# Patient Record
Sex: Male | Born: 1980 | Race: White | Hispanic: No | Marital: Single | Smoking: Former smoker
Health system: Southern US, Community
[De-identification: ages and names within clinical notes are randomized; demographics above are authoritative.]

## PROBLEM LIST (undated history)

## (undated) DIAGNOSIS — N289 Disorder of kidney and ureter, unspecified: Secondary | ICD-10-CM

## (undated) DIAGNOSIS — Z9889 Other specified postprocedural states: Secondary | ICD-10-CM

## (undated) DIAGNOSIS — Z8719 Personal history of other diseases of the digestive system: Secondary | ICD-10-CM

## (undated) DIAGNOSIS — J45909 Unspecified asthma, uncomplicated: Secondary | ICD-10-CM

## (undated) DIAGNOSIS — G8929 Other chronic pain: Secondary | ICD-10-CM

## (undated) DIAGNOSIS — K219 Gastro-esophageal reflux disease without esophagitis: Secondary | ICD-10-CM

## (undated) DIAGNOSIS — M549 Dorsalgia, unspecified: Secondary | ICD-10-CM

## (undated) HISTORY — PX: APPENDECTOMY: SHX54

## (undated) HISTORY — PX: RHINOPLASTY: SUR1284

## (undated) HISTORY — PX: UPPER ENDOSCOPY W/ ESOPHAGEAL MANOMETRY: SHX2604

---

## 2000-04-27 ENCOUNTER — Ambulatory Visit (HOSPITAL_COMMUNITY): Admission: RE | Admit: 2000-04-27 | Discharge: 2000-04-27 | Payer: Self-pay | Admitting: Family Medicine

## 2000-04-27 ENCOUNTER — Encounter: Payer: Self-pay | Admitting: Family Medicine

## 2000-05-10 ENCOUNTER — Encounter: Payer: Self-pay | Admitting: Emergency Medicine

## 2000-05-10 ENCOUNTER — Ambulatory Visit (HOSPITAL_COMMUNITY): Admission: EM | Admit: 2000-05-10 | Discharge: 2000-05-11 | Payer: Self-pay | Admitting: Emergency Medicine

## 2000-06-26 ENCOUNTER — Ambulatory Visit (HOSPITAL_COMMUNITY): Admission: EM | Admit: 2000-06-26 | Discharge: 2000-06-27 | Payer: Self-pay | Admitting: *Deleted

## 2000-07-11 ENCOUNTER — Ambulatory Visit (HOSPITAL_COMMUNITY): Admission: RE | Admit: 2000-07-11 | Discharge: 2000-07-11 | Payer: Self-pay | Admitting: Gastroenterology

## 2000-07-11 ENCOUNTER — Encounter: Payer: Self-pay | Admitting: Gastroenterology

## 2000-10-19 ENCOUNTER — Encounter: Payer: Self-pay | Admitting: Emergency Medicine

## 2000-10-19 ENCOUNTER — Emergency Department (HOSPITAL_COMMUNITY): Admission: EM | Admit: 2000-10-19 | Discharge: 2000-10-19 | Payer: Self-pay | Admitting: Emergency Medicine

## 2002-10-29 ENCOUNTER — Encounter: Payer: Self-pay | Admitting: Emergency Medicine

## 2002-10-29 ENCOUNTER — Emergency Department (HOSPITAL_COMMUNITY): Admission: EM | Admit: 2002-10-29 | Discharge: 2002-10-29 | Payer: Self-pay | Admitting: Emergency Medicine

## 2004-05-09 ENCOUNTER — Emergency Department (HOSPITAL_COMMUNITY): Admission: EM | Admit: 2004-05-09 | Discharge: 2004-05-09 | Payer: Self-pay | Admitting: Emergency Medicine

## 2005-12-11 ENCOUNTER — Emergency Department (HOSPITAL_COMMUNITY): Admission: EM | Admit: 2005-12-11 | Discharge: 2005-12-11 | Payer: Self-pay | Admitting: Emergency Medicine

## 2010-07-25 ENCOUNTER — Emergency Department (HOSPITAL_BASED_OUTPATIENT_CLINIC_OR_DEPARTMENT_OTHER): Admission: EM | Admit: 2010-07-25 | Discharge: 2010-07-25 | Payer: Self-pay | Admitting: Emergency Medicine

## 2010-10-28 ENCOUNTER — Emergency Department (HOSPITAL_BASED_OUTPATIENT_CLINIC_OR_DEPARTMENT_OTHER): Admission: EM | Admit: 2010-10-28 | Discharge: 2010-10-28 | Payer: Self-pay | Admitting: Emergency Medicine

## 2010-10-28 ENCOUNTER — Ambulatory Visit: Payer: Self-pay | Admitting: Diagnostic Radiology

## 2010-11-09 ENCOUNTER — Emergency Department (HOSPITAL_BASED_OUTPATIENT_CLINIC_OR_DEPARTMENT_OTHER): Admission: EM | Admit: 2010-11-09 | Discharge: 2010-11-09 | Payer: Self-pay | Admitting: Emergency Medicine

## 2010-11-09 ENCOUNTER — Ambulatory Visit: Payer: Self-pay | Admitting: Diagnostic Radiology

## 2011-02-23 LAB — URINALYSIS, ROUTINE W REFLEX MICROSCOPIC
Glucose, UA: NEGATIVE mg/dL
Ketones, ur: >80 mg/dL — AB
Leukocytes, UA: NEGATIVE
Nitrite: NEGATIVE
Protein, ur: 100 mg/dL — AB
Specific Gravity, Urine: 1.036 — ABNORMAL HIGH (ref 1.005–1.030)
Urobilinogen, UA: 0.2 mg/dL (ref 0.0–1.0)
Urobilinogen, UA: 1 mg/dL (ref 0.0–1.0)

## 2011-02-23 LAB — CBC
Hemoglobin: 17.2 g/dL — ABNORMAL HIGH (ref 13.0–17.0)
MCH: 32.5 pg (ref 26.0–34.0)
MCHC: 36 g/dL (ref 30.0–36.0)
MCV: 90.1 fL (ref 78.0–100.0)
Platelets: 287 10*3/uL (ref 150–400)
RBC: 5.3 MIL/uL (ref 4.22–5.81)

## 2011-02-23 LAB — URINE CULTURE
Colony Count: 5000
Culture  Setup Time: 201111162159

## 2011-02-23 LAB — BASIC METABOLIC PANEL
CO2: 22 mEq/L (ref 19–32)
Calcium: 10.1 mg/dL (ref 8.4–10.5)
Chloride: 107 mEq/L (ref 96–112)
Creatinine, Ser: 1.2 mg/dL (ref 0.4–1.5)
GFR calc Af Amer: >60 mL/min (ref >60)
Glucose, Bld: 193 mg/dL — ABNORMAL HIGH (ref 70–99)

## 2011-02-23 LAB — DIFFERENTIAL
Basophils Relative: 0 % (ref 0–1)
Eosinophils Absolute: 0.8 10*3/uL — ABNORMAL HIGH (ref 0.0–0.7)
Eosinophils Relative: 5 % (ref 0–5)
Lymphs Abs: 3.7 10*3/uL (ref 0.7–4.0)
Monocytes Relative: 6 % (ref 3–12)

## 2011-02-23 LAB — URINE MICROSCOPIC-ADD ON

## 2011-03-31 ENCOUNTER — Emergency Department (HOSPITAL_BASED_OUTPATIENT_CLINIC_OR_DEPARTMENT_OTHER)
Admission: EM | Admit: 2011-03-31 | Discharge: 2011-04-01 | Disposition: A | Discharge status: Home / Self Care | Attending: Emergency Medicine | Admitting: Emergency Medicine

## 2011-03-31 DIAGNOSIS — R109 Unspecified abdominal pain: Secondary | ICD-10-CM | POA: Insufficient documentation

## 2011-03-31 DIAGNOSIS — Z79899 Other long term (current) drug therapy: Secondary | ICD-10-CM | POA: Insufficient documentation

## 2011-03-31 DIAGNOSIS — J45909 Unspecified asthma, uncomplicated: Secondary | ICD-10-CM | POA: Insufficient documentation

## 2011-03-31 DIAGNOSIS — G8929 Other chronic pain: Secondary | ICD-10-CM | POA: Insufficient documentation

## 2011-03-31 LAB — LIPASE, BLOOD: Lipase: 37 U/L (ref 23–300)

## 2011-03-31 LAB — CBC
MCH: 31.2 pg (ref 26.0–34.0)
MCV: 89.5 fL (ref 78.0–100.0)
Platelets: 215 10*3/uL (ref 150–400)
RBC: 4.93 MIL/uL (ref 4.22–5.81)

## 2011-03-31 LAB — URINALYSIS, ROUTINE W REFLEX MICROSCOPIC
Bilirubin Urine: NEGATIVE
Glucose, UA: NEGATIVE mg/dL
Hgb urine dipstick: NEGATIVE
Ketones, ur: NEGATIVE mg/dL
Protein, ur: NEGATIVE mg/dL

## 2011-03-31 LAB — COMPREHENSIVE METABOLIC PANEL
BUN: 13 mg/dL (ref 6–23)
CO2: 25 mEq/L (ref 19–32)
Chloride: 105 mEq/L (ref 96–112)
Creatinine, Ser: 1 mg/dL (ref 0.4–1.5)
GFR calc non Af Amer: >60 mL/min (ref >60)
Total Bilirubin: 0.7 mg/dL (ref 0.3–1.2)

## 2011-03-31 LAB — DIFFERENTIAL
Eosinophils Absolute: 0.4 10*3/uL (ref 0.0–0.7)
Lymphs Abs: 3.2 10*3/uL (ref 0.7–4.0)
Monocytes Relative: 8 % (ref 3–12)
Neutrophils Relative %: 55 % (ref 43–77)

## 2011-04-01 LAB — POCT TOXICOLOGY PANEL

## 2011-04-30 NOTE — Procedures (Signed)
[facility]  Patient:    Scott Brock                      MRN: [id number] Proc. Date: 07/11/00 Adm. Date:  [id number] Attending:  Rich Brave CC:         Elana Alm. Eliezer Lofts., M.D.   Procedure Report  PROCEDURE:  Upper endoscopy with Savary dilatation of the esophagus.  ENDOSCOPIST:  Florencia Reasons, M.D.  ANESTHESIA:  INDICATIONS:  A 30 year old male with recurrent food impactions and ongoing dysphagia symptoms.  FINDINGS:  Diffuse esophageal narrowing with mild mucosal irregularity. Dilatation performed to 16 mm.  DESCRIPTION OF PROCEDURE:  The nature, purpose, and risks of the procedure had been carefully discussed with the patient and his mother, including the risks of esophageal perforation and the possible need for emergency surgery.  He came as an outpatient to the endoscopy unit and the patient provided written consent.  The patient was taken to radiology where intravenous sedation with fentanyl 100 mcg and Versed 12 mg IV was administered in a gradual progressive fashion. Despite this large amount of medication, the patient remained awake, although somnolent, and asking "asking why he was not asleep."  He did appear, adequately sedated for the procedure.  The Olympus adult video endoscopy was passed under direct vision.  The vocal cords looked normal.  The esophagus was entered under direct vision.  The esophageal mucosa had a slightly irregular "wash board" pattern to it, and in the distal portion, there was some slight inflammatory changes without antral ulceration of the esophagus, but rather some erythema and perhaps some slight friability or erosive changes.  The scope passed easily into the stomach.  A small hiatal hernia was probably present, but no discrete rent was seen on this occasion.  The stomach contained a moderately large clear residual which was suctioned up.  The gastric mucosa was unremarkable,  without evidence of gastritis, erosions, ulcers, polyps, or masses, and a retroflexed view of the proximal stomach was unremarkable, as was the pylorus, duodenal bulb, and second duodenum.  Savary dilatation was then performed in the standard fashion.  The spring-tipped guidewire was passed into the antrum of the stomach and the scope was removed in an exchanged fashion, leaving the guidewire in place. After fluoroscopic confirmation of the appropriate positioning of the wire, I advanced a 16 mm Savary dilator over the guidewire, probably about halfway down the patients esophagus, where upon I encountered smooth resistance and therefore switched to a 14 mm dilator which passed quite easily through the esophagus, again under fluoroscopic guidance.  I then passed a 15 mm Savary dilator, and a 16 mm Savary dilator, each time with smooth resistance, perhaps a slight "give" in the more proximal esophageal region, and using fluoroscopic monitoring.  The patient was then re-endoscoped under direct vision.  There was a fair amount of fresh hemorrhage within the esophageal lumen consistent with mucosal disruption.  In the mid esophagus, and also what appeared to be a ring at the squamocolumnar junction, there appeared to be some mucosal "rent" or laceration, without any tissue flaps or evidence of perforation.  The scope was removed the patient.  He tolerated the procedure well and there were no evident complications.  IMPRESSION: 1. Diffuse mucosal irregularity within the esophagus and slight inflammatory    changes in the distal esophagus, without active ulceration, but raising the    question of chronic acid exposure.  No evidence of Barretts  esophagus and    no discrete reflux esophagitis. 2. Probable diffuse esophageal narrowing, including evidence of an esophageal    ring at the squamocolumnar junction, but also evidence of concentric    narrowing in the more proximal esophagus. 3.  Savary dilatation performed to 16 mm in a step-wise fashion as described    above.  PLAN:  The patient and his parents will be instructed in symptoms of complications as might be reflected of esophageal perforation.  I will initiate PPI therapy for the time being and ask the patient to follow up with me at the office in approximately one month. DD:  07/11/00 TD:  07/12/00 Job: [id number]

## 2011-04-30 NOTE — Procedures (Signed)
[facility]  Patient:    Scott Brock                      MRN: [id number] Proc. Date: 06/26/00 Adm. Date:  [id number] Disc. Date: [id number] Attending:  Rich Brave                           Procedure Report  PREOPERATIVE DIAGNOSIS:  Food impaction.  POSTOPERATIVE DIAGNOSIS:  Esophageal ring with food impaction at this time.  PROCEDURE PERFORMED:  Esophagogastroduodenoscopy with dislodging of food debris that was present.  ENDOSCOPIST:  Dortha Kern, Montez Hageman., M.D.  MEDICATIONS:  Demerol 80 mg IV and Versed 8 mg IV over a 10 minute period of time.  INSTRUMENT:  Olympus video panendoscope.  BRIEF HISTORY:  This 30 year old gentleman is associated with having food impaction that was present.  He was seen by another gastroenterologist in [place] who recommended having dilatation of his esophagus.  The patient did not follow through with the procedure and subsequently had a recurrent food impaction that was noted.  The patient came back to the emergency room where it was documented that the food impaction was present.  I was contacted at that time.  PHYSICAL EXAMINATION:  GENERAL:  He is a pleasant gentleman who appears to be in no acute distress at this point.  VITAL SIGNS:  His vital signs were stable.  HEENT:  His HEENT examination is anicteric.  NECK:  Supple.  LUNGS:  Clear.  HEART:  Regular rate and rhythm without heaves, thrills, murmurs or gallops.  ABDOMEN:  Soft, no tenderness, no hepatosplenomegaly appreciated.  EXTREMITIES:  Within normal limits.  PLAN:  Will proceed with the endoscopic examination for disimpaction of the food product that is present.  INFORMED CONSENT:  The patient was advised of the procedure, indications and risks involved.  The patient and his family has signed the consent.  PREOPERATIVE PREPARATION:  The patient is brought in the endoscopy unit with the IV.  The IV sedating medication was  started.  The monitor was placed on the patient to monitor the patients vital signs and oxygen saturation.  Nasal oxygen at 2-liters per minute was used and after adequate sedation was performed and the procedure was begun.  DESCRIPTION OF PROCEDURE:  The instrument was advanced with the patient lying in the left lateral position via direct technique without difficulty.  The oropharyngeal, epiglottis, vocal cords and piriformis sinuses appeared to be grossly within normal limits.  The esophagus showed evidence of food impaction of meat that was present in the distal portion of the esophageal area that was noted.  The instrument initially could not be advanced beyond the area that was present.  With this, a wire basket was then advanced through the instrument at this time with trapping of various food particles and retracting it back out from the area.  Because of this being slightly difficult for the patient to tolerate and advanced into the area such that better multiple entrance and removal of the instrument could be performed.  The instrument was able to advance back in with retraction of food material that was present.  After removal of varied amount of food product that was present, the instrument was able to advance the instrument further into the area until it advanced into the gastric area.  The rest of the retained debris that was left in the esophagus was able to advance that was present.  The gastric area showed a mucous leak of debris material that was present. There was some evidence of focal inflammation that was present in the gastric area at this time.  The antral area appeared to show no evidence of acute inflammatory process that was ongoing.  The pylorus was normal and upon advancement, the pyloric canal, duodenal bulb and the second portion appeared to be within normal limits.  The instrument was retracted back where a retroflex view of the cardia showed evidence of  the debris, but no evidence of any obstructive process that was present.  As the instrument was retracted back it was noted to have a questionable Schatzkis ring that was appreciated at this time.  Because of this ring that was noted no further process that was necessary to be done.  The instrument was subsequently removed prior without difficulty.  The patient tolerated the procedure well.  TREATMENT:  I have recommended that the patient follow with me at this time or with the previous gastroenterologist to have the area dilated at that time. Depending upon those results will determine the cause of therapy. DD:  07/29/00 TD:  07/31/00 Job: [id number]

## 2011-04-30 NOTE — Procedures (Signed)
[facility]  Patient:    Scott Brock, Scott Brock                     MRN: [id number] Proc. Date: 05/11/00 Adm. Date:  [id number] Disc. Date: [id number] Attending:  Tobey Bride CC:         Elana Alm Eliezer Lofts., M.D.                           Procedure Report  PROCEDURE:  Upper endoscopy with removal of esophageal foreign body.  INDICATIONS:  An 30 year old with a longstanding history of intermittent dysphagia symptoms, who presented tonight for the first time ever with a prolonged food impaction (roast beef).  A trial of nitroglycerin in the emergency room was unsuccessful in relieving the obstruction.  FINDINGS:  Esophageal ring causing food impaction, which was successfully removed.  DESCRIPTION OF PROCEDURE:  The nature, purpose, and risks of the procedure were discussed with the patient, who provided written consent.  Sedation was fentanyl 75 mcg and Versed 7.5 mg IV without arrhythmias or desaturation apart from some sinus tachycardia.  The Olympus video endoscope was passed under direct vision.  The vocal cords were very briefly seen and appeared grossly normal.  The esophagus was entered without undue difficulty.  The esophageal mucosa appeared to have some squamous thickening, perhaps due to the fact that the food impaction had existed for approximately six hours by the time of this procedure.  There was no definite reflux esophagitis.  In the distal esophagus, I encountered the food impaction, which consisted of several pieces of impacted meat, which were removed by snare technique and withdraw out through the patients mouth on two occasions, but when we went down for the third piece, it spontaneously passed into the stomach.  This disclosed an esophageal ring, which appeared somewhat muscular and slightly inflamed.  There was no evidence of Barretts esophagus or any definite active reflux esophagitis, neoplasia, or varices.  The ring was  not particularly tight in that it offered no resistance to passage of the 10 mm endoscope.  Below it was a 2-3 cm hiatal hernia.  The stomach was entered.  It contained just a small residual, but essentially no food.  A retroflexed view of the proximal stomach showed that the diaphragmatic hiatus was somewhat patulous.  The gastric mucosa was unremarkable, specifically without gastritis, erosions, ulcers, polyps, or masses, and the pylorus, duodenal bulb, and proximal second duodenum looked normal.  The scope was then removed from the patient, who tolerated the procedure well. There were no apparent complications.  IMPRESSION: 1. Successful removal of food impaction in the distal esophagus. 2. Esophageal ring about small hiatal hernia, causing the above-mentioned food    impaction.  PLAN:  Office follow-up the near future to discuss possible elective esophageal dilatation. DD:  05/11/00 TD:  05/12/00 Job: [id number]

## 2011-08-30 IMAGING — CR DG ABDOMEN 1V
2 series · 2 of 2 positions shown · non-contrast
Comparison: CT dated 10/28/2010

CLINICAL DATA: Severe left flank pain.  History of renal stone 3
weeks prior.

ABDOMEN - 1 VIEW

[t abdomen supine (1 of 2)]
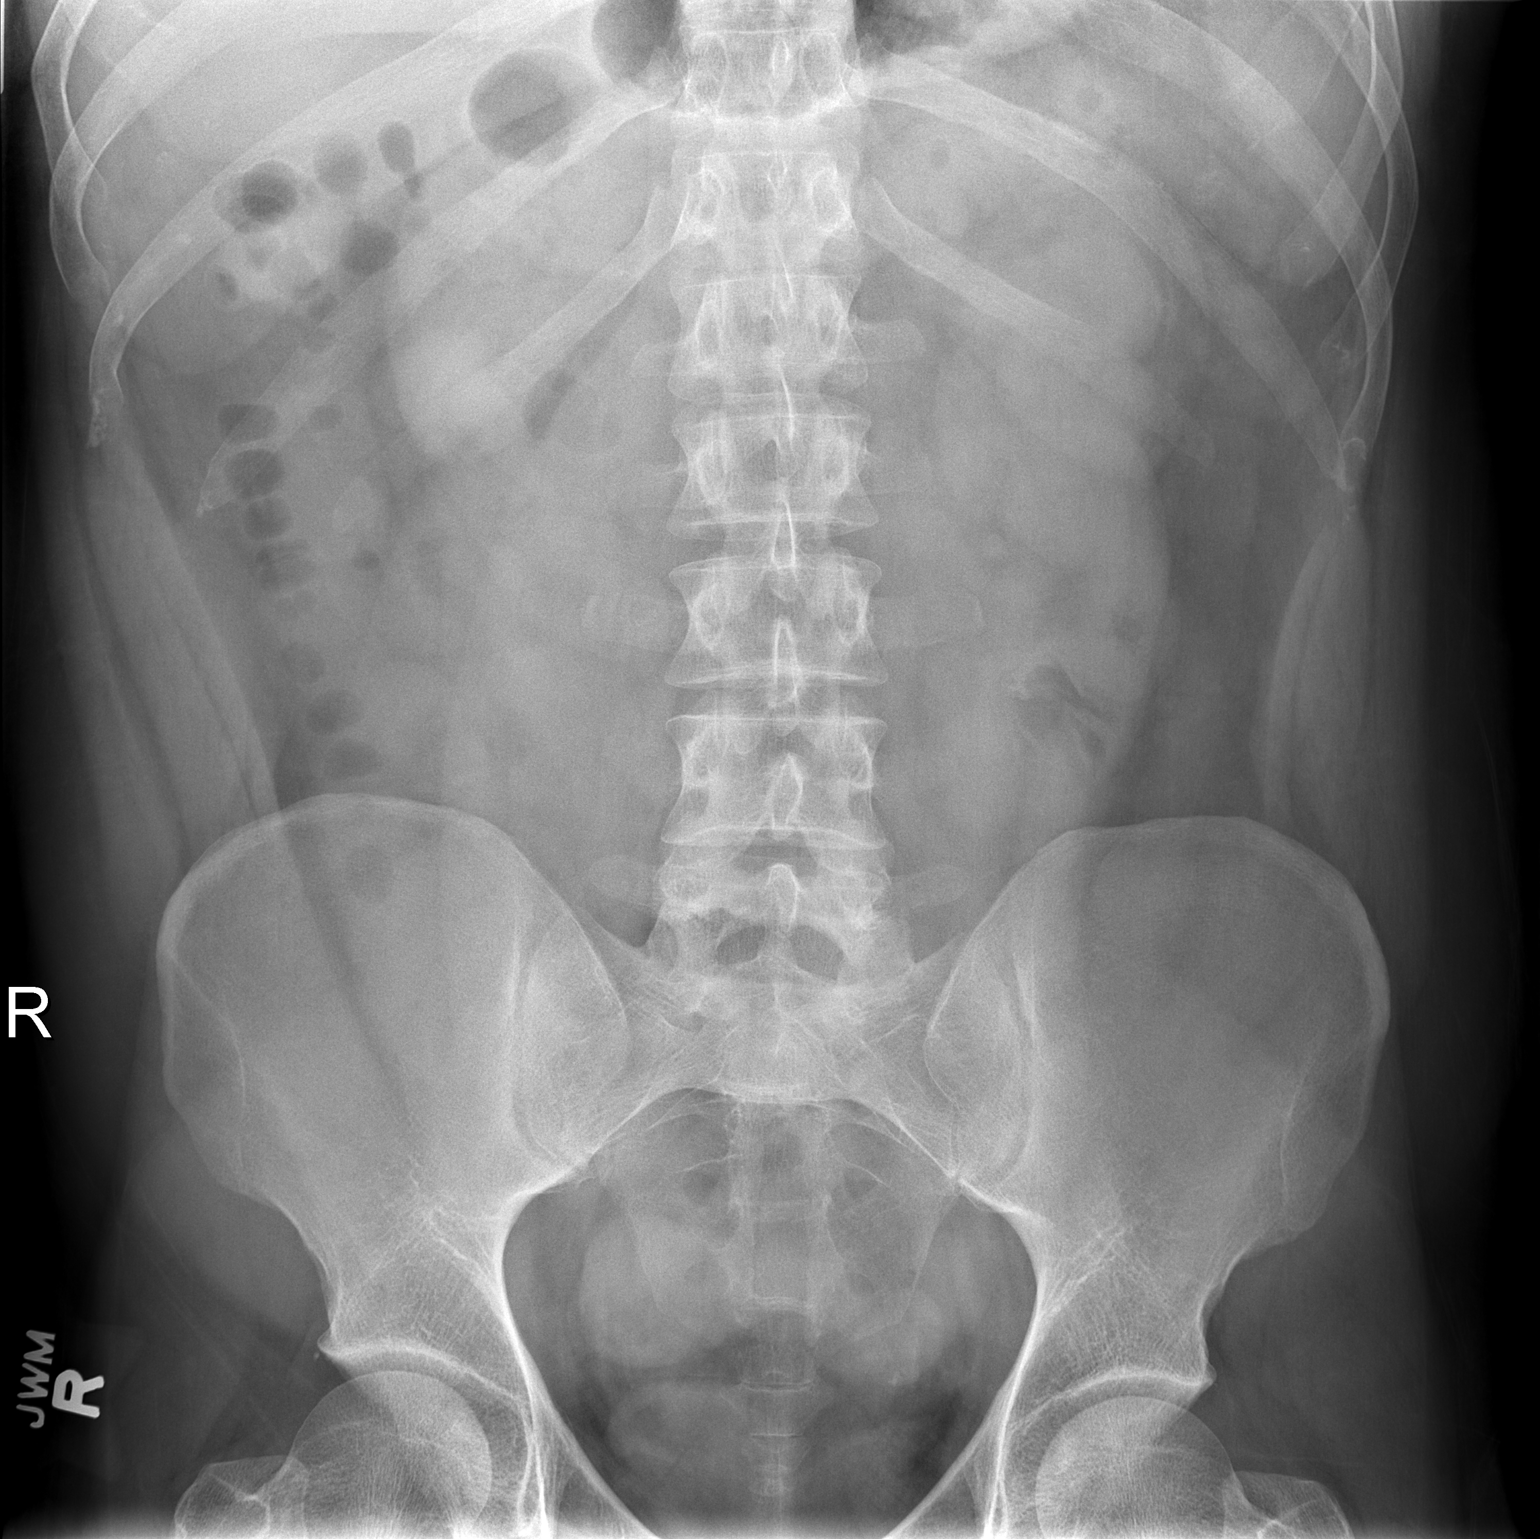

[t abdomen supine (2 of 2)]
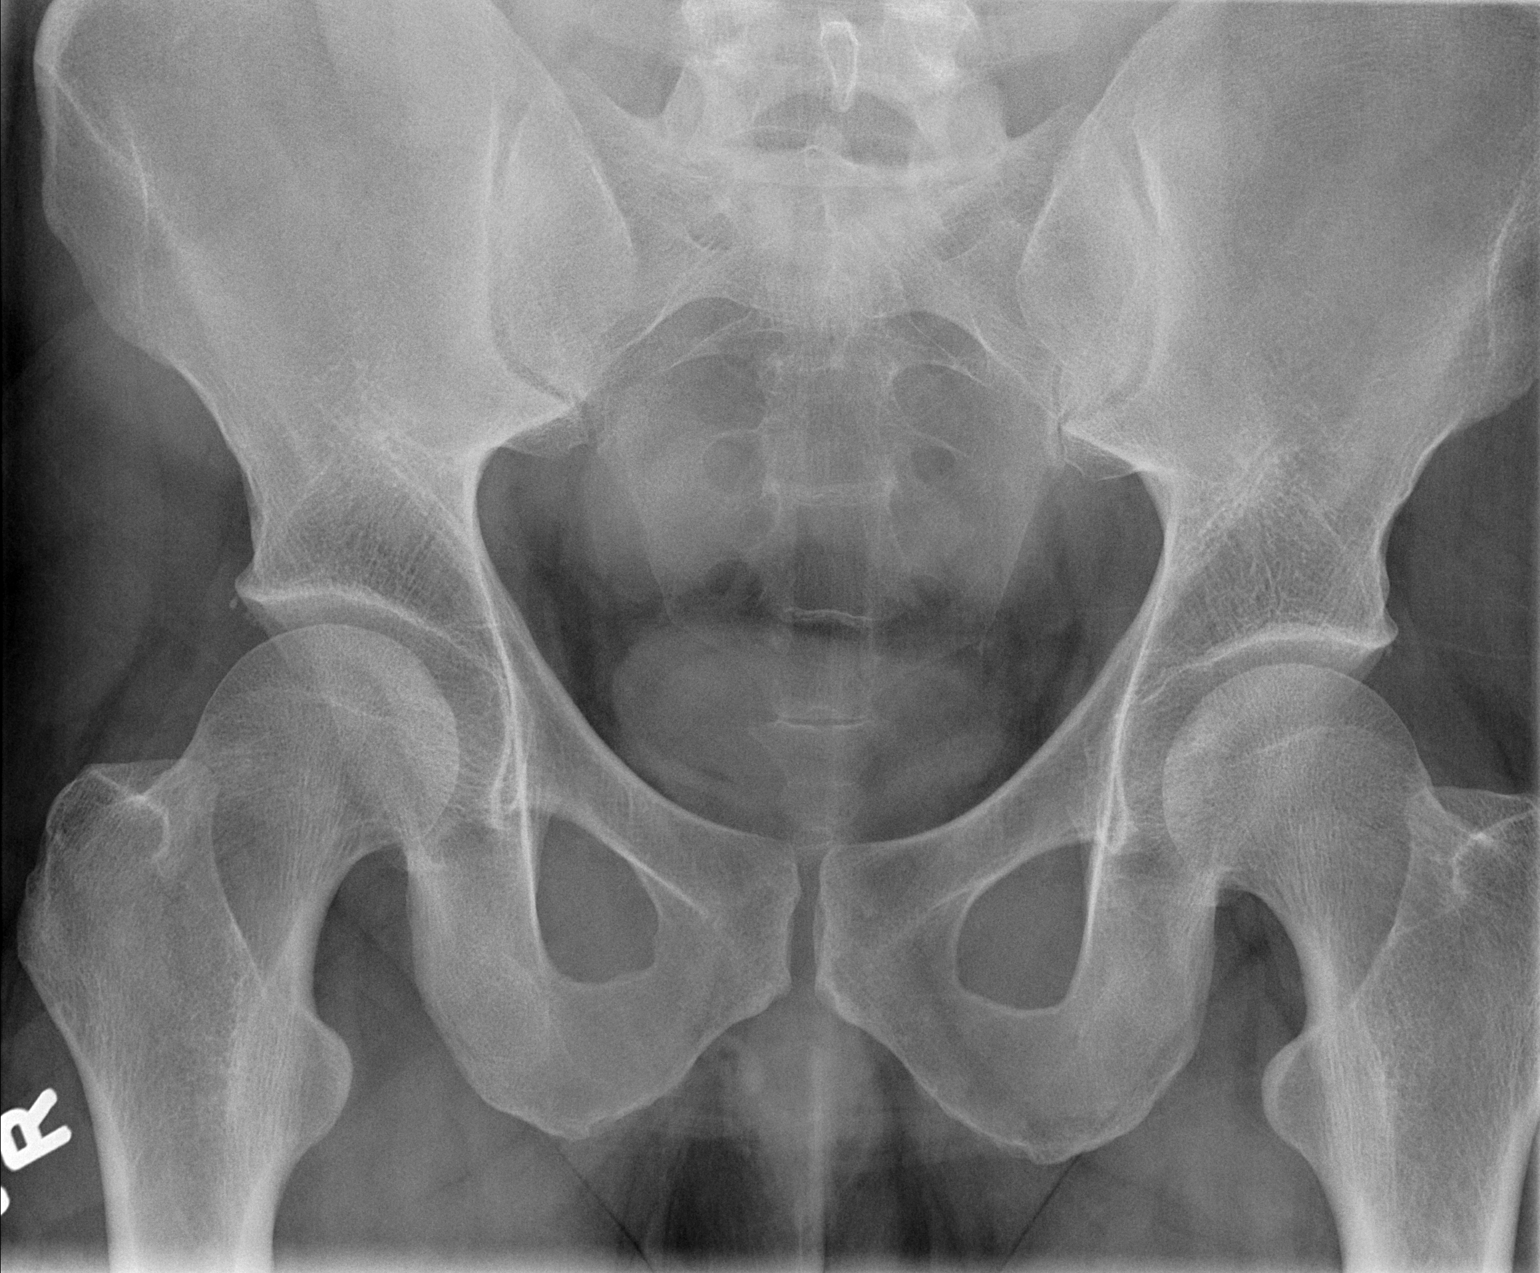

[2 of 2 positions shown; findings below may reference images not displayed]

FINDINGS: No calcification projects over the kidneys.  There is a
punctate calcification near the UVJ which likely corresponds to the
previously described stone which measures approximately 3 mm.  No
other unexpected calcifications are seen.  The bowel gas pattern is
unremarkable.
IMPRESSION: Left-sided renal calculus likely at the UVJ.

## 2011-10-26 ENCOUNTER — Emergency Department (HOSPITAL_BASED_OUTPATIENT_CLINIC_OR_DEPARTMENT_OTHER)
Admission: EM | Admit: 2011-10-26 | Discharge: 2011-10-26 | Disposition: A | Discharge status: Home / Self Care | Attending: Emergency Medicine | Admitting: Emergency Medicine

## 2011-10-26 DIAGNOSIS — T18108A Unspecified foreign body in esophagus causing other injury, initial encounter: Secondary | ICD-10-CM | POA: Insufficient documentation

## 2011-10-26 DIAGNOSIS — Z79899 Other long term (current) drug therapy: Secondary | ICD-10-CM | POA: Insufficient documentation

## 2011-10-26 DIAGNOSIS — IMO0002 Reserved for concepts with insufficient information to code with codable children: Secondary | ICD-10-CM | POA: Insufficient documentation

## 2011-10-26 DIAGNOSIS — G8929 Other chronic pain: Secondary | ICD-10-CM | POA: Insufficient documentation

## 2011-10-26 HISTORY — DX: Dorsalgia, unspecified: M54.9

## 2011-10-26 HISTORY — DX: Other chronic pain: G89.29

## 2011-10-26 HISTORY — DX: Other specified postprocedural states: Z98.890

## 2011-10-26 HISTORY — DX: Personal history of other diseases of the digestive system: Z87.19

## 2011-10-26 MED ORDER — MIDAZOLAM HCL 2 MG/2ML IJ SOLN
INTRAMUSCULAR | Status: AC
  Filled 2011-10-26: qty 4

## 2011-10-26 MED ORDER — GLUCAGON HCL (RDNA) 1 MG IJ SOLR
1.0000 mg | Freq: Once | INTRAMUSCULAR | Status: AC
  Administered 2011-10-26: 1 mg via INTRAVENOUS
  Filled 2011-10-26: qty 1

## 2011-10-26 MED ORDER — MIDAZOLAM HCL 5 MG/ML IJ SOLN
4.0000 mg | Freq: Once | INTRAMUSCULAR | Status: DC
  Filled 2011-10-26: qty 0.8

## 2011-10-26 NOTE — ED Provider Notes (Signed)
History/physical exam/procedure(s) were performed by non-physician practitioner and as supervising physician I was immediately available for consultation/collaboration. I have reviewed all notes and am in agreement with care and plan.   Hilario Quarry, MD 10/26/11 2223

## 2011-10-26 NOTE — ED Notes (Signed)
Patient refused to have treatment of versed.  States he is going to sign out AMA and go to a hospital that will give him an endoscopy tonight.  Update to EDP.  Patient drank some gingerale, and upon returning to room, patient took another swallow of gingerale and stated that the obstruction cleared.  Patient was able to eat crackers and drink gingerale with out difficulty.  Update given to EDP.

## 2011-10-26 NOTE — ED Provider Notes (Signed)
History     CSN: [id number] Arrival date & time: 10/26/2011  4:29 PM   First MD Initiated Contact with Patient 10/26/11 1641      Chief Complaint  Patient presents with  . Swallowed Foreign Body    (Consider location/radiation/quality/duration/timing/severity/associated sxs/prior treatment) HPI Comments: Pt state that he was eating mashed potatoes mixed with corn and chicken and he feels like it is stuck:pt states that she has had a problem with this before and she has had to have his esophagus stretched:pt states that when he spits is spitting with blood  Patient is a 30 y.o. male presenting with foreign body swallowed. The history is provided by the patient. No language interpreter was used.  Swallowed Foreign Body This is a recurrent problem. The current episode started today. The problem occurs constantly. The problem has been unchanged. Pertinent negatives include no abdominal pain or fever. The symptoms are aggravated by nothing. He has tried nothing for the symptoms.    Past Medical History  Diagnosis Date  . Status post dilation of esophageal narrowing   . Chronic back pain     Past Surgical History  Procedure Date  . Upper endoscopy w/ esophageal manometry   . Rhinoplasty     No family history on file.  History  Substance Use Topics  . Smoking status: Never Smoker   . Smokeless tobacco: Never Used  . Alcohol Use: No      Review of Systems  Constitutional: Negative for fever.  Gastrointestinal: Negative for abdominal pain.  All other systems reviewed and are negative.    Allergies  Review of patient's allergies indicates no known allergies.  Home Medications   Current Outpatient Rx  Name Route Sig Dispense Refill  . AMPHETAMINE-DEXTROAMPHETAMINE 30 MG PO CP24 Oral Take 30 mg by mouth every morning.      Marland Kitchen FLUTICASONE-SALMETEROL 250-50 MCG/DOSE IN AEPB Inhalation Inhale 1 puff into the lungs every 12 (twelve) hours.      . IBUPROFEN 800 MG PO TABS  Oral Take 800 mg by mouth every 8 (eight) hours as needed. For pain     . OMEPRAZOLE 20 MG PO CPDR Oral Take 40 mg by mouth daily as needed. For acid reflux     . OXYCODONE HCL 20 MG PO TB12 Oral Take 20 mg by mouth every 12 (twelve) hours as needed. For pain     . OXYCODONE HCL 30 MG PO TB12 Oral Take 30 mg by mouth 4 (four) times daily as needed. For pain     . VALACYCLOVIR HCL 1 G PO TABS Oral Take 1,000-2,000 mg by mouth 2 (two) times daily.        BP 147/89  Pulse 79  Temp(Src) 98.9 F (37.2 C) (Oral)  Resp 18  Ht 6\' 1"  (1.854 m)  Wt 200 lb (90.719 kg)  BMI 26.39 kg/m2  SpO2 100%  Physical Exam  Nursing note and vitals reviewed. Constitutional: He is oriented to person, place, and time. He appears well-developed and well-nourished.  HENT:  Head: Normocephalic and atraumatic.  Eyes: Pupils are equal, round, and reactive to light.  Neck: Normal range of motion. Neck supple.  Cardiovascular: Normal rate and regular rhythm.   Pulmonary/Chest: Effort normal and breath sounds normal.  Abdominal: Soft. Bowel sounds are normal.  Musculoskeletal: Normal range of motion.  Neurological: He is alert and oriented to person, place, and time.  Skin: Skin is warm and dry.  Psychiatric: He has a normal mood and affect.  ED Course  Procedures (including critical care time)  Labs Reviewed - No data to display No results found.   1. Esophageal foreign body       MDM  6:44 PM Pt states that it feels like it went down:will try some crackers and drink 7:06 PM Pt tolerating [id number] refer to gi for follow up      Teressa Lower, NP 10/26/11 1906

## 2011-10-26 NOTE — ED Notes (Signed)
Pt states he has a chicken bone stuck in throat.

## 2013-05-21 ENCOUNTER — Emergency Department (HOSPITAL_BASED_OUTPATIENT_CLINIC_OR_DEPARTMENT_OTHER)
Admission: EM | Admit: 2013-05-21 | Discharge: 2013-05-21 | Disposition: A | Discharge status: Home / Self Care | Payer: TRICARE For Life (TFL) | Attending: Emergency Medicine | Admitting: Emergency Medicine

## 2013-05-21 ENCOUNTER — Emergency Department (HOSPITAL_BASED_OUTPATIENT_CLINIC_OR_DEPARTMENT_OTHER): Payer: TRICARE For Life (TFL)

## 2013-05-21 ENCOUNTER — Encounter (HOSPITAL_BASED_OUTPATIENT_CLINIC_OR_DEPARTMENT_OTHER): Payer: Self-pay | Admitting: *Deleted

## 2013-05-21 DIAGNOSIS — R350 Frequency of micturition: Secondary | ICD-10-CM | POA: Insufficient documentation

## 2013-05-21 DIAGNOSIS — G8929 Other chronic pain: Secondary | ICD-10-CM | POA: Insufficient documentation

## 2013-05-21 DIAGNOSIS — R109 Unspecified abdominal pain: Secondary | ICD-10-CM | POA: Insufficient documentation

## 2013-05-21 DIAGNOSIS — Z87448 Personal history of other diseases of urinary system: Secondary | ICD-10-CM | POA: Insufficient documentation

## 2013-05-21 DIAGNOSIS — Z87442 Personal history of urinary calculi: Secondary | ICD-10-CM | POA: Insufficient documentation

## 2013-05-21 DIAGNOSIS — M549 Dorsalgia, unspecified: Secondary | ICD-10-CM | POA: Insufficient documentation

## 2013-05-21 DIAGNOSIS — Z79899 Other long term (current) drug therapy: Secondary | ICD-10-CM | POA: Insufficient documentation

## 2013-05-21 DIAGNOSIS — J45909 Unspecified asthma, uncomplicated: Secondary | ICD-10-CM | POA: Insufficient documentation

## 2013-05-21 DIAGNOSIS — R300 Dysuria: Secondary | ICD-10-CM | POA: Insufficient documentation

## 2013-05-21 DIAGNOSIS — Z9889 Other specified postprocedural states: Secondary | ICD-10-CM | POA: Insufficient documentation

## 2013-05-21 HISTORY — DX: Unspecified asthma, uncomplicated: J45.909

## 2013-05-21 HISTORY — DX: Disorder of kidney and ureter, unspecified: N28.9

## 2013-05-21 LAB — COMPREHENSIVE METABOLIC PANEL
Alkaline Phosphatase: 111 U/L (ref 39–117)
BUN: 14 mg/dL (ref 6–23)
Chloride: 100 mEq/L (ref 96–112)
Creatinine, Ser: 0.9 mg/dL (ref 0.50–1.35)
GFR calc Af Amer: >90 mL/min (ref >90)
GFR calc non Af Amer: >90 mL/min (ref >90)
Glucose, Bld: 112 mg/dL — ABNORMAL HIGH (ref 70–99)
Potassium: 3.7 mEq/L (ref 3.5–5.1)
Total Bilirubin: 0.5 mg/dL (ref 0.3–1.2)

## 2013-05-21 LAB — URINALYSIS, ROUTINE W REFLEX MICROSCOPIC
Glucose, UA: NEGATIVE mg/dL
Ketones, ur: 40 mg/dL — AB
Nitrite: NEGATIVE
Specific Gravity, Urine: 1.026 (ref 1.005–1.030)
pH: 6 (ref 5.0–8.0)

## 2013-05-21 LAB — CBC WITH DIFFERENTIAL/PLATELET
HCT: 47 % (ref 39.0–52.0)
Hemoglobin: 17 g/dL (ref 13.0–17.0)
Lymphs Abs: 3.8 10*3/uL (ref 0.7–4.0)
MCH: 32.9 pg (ref 26.0–34.0)
Monocytes Absolute: 0.9 10*3/uL (ref 0.1–1.0)
Monocytes Relative: 8 % (ref 3–12)
Neutro Abs: 6.7 10*3/uL (ref 1.7–7.7)
Neutrophils Relative %: 57 % (ref 43–77)
RBC: 5.17 MIL/uL (ref 4.22–5.81)

## 2013-05-21 LAB — LIPASE, BLOOD: Lipase: 24 U/L (ref 11–59)

## 2013-05-21 MED ORDER — HYDROMORPHONE HCL PF 1 MG/ML IJ SOLN
1.0000 mg | Freq: Once | INTRAMUSCULAR | Status: AC
  Administered 2013-05-21: 1 mg via INTRAVENOUS
  Filled 2013-05-21: qty 1

## 2013-05-21 MED ORDER — IOHEXOL 300 MG/ML  SOLN
50.0000 mL | Freq: Once | INTRAMUSCULAR | Status: AC | PRN
  Administered 2013-05-21: 50 mL via ORAL

## 2013-05-21 MED ORDER — IOHEXOL 300 MG/ML  SOLN
100.0000 mL | Freq: Once | INTRAMUSCULAR | Status: AC | PRN
  Administered 2013-05-21: 100 mL via INTRAVENOUS

## 2013-05-21 MED ORDER — OXYCODONE-ACETAMINOPHEN 5-325 MG PO TABS: 2.0000 | ORAL_TABLET | Freq: Four times a day (QID) | ORAL | Status: DC | PRN

## 2013-05-21 MED ORDER — KETOROLAC TROMETHAMINE 30 MG/ML IJ SOLN
30.0000 mg | Freq: Once | INTRAMUSCULAR | Status: AC
  Administered 2013-05-21: 30 mg via INTRAVENOUS
  Filled 2013-05-21: qty 1

## 2013-05-21 NOTE — ED Provider Notes (Signed)
History    This chart was scribed for Scott Horn, MD by Donne Anon, ED Scribe. This patient was seen in room MH09/MH09 and the patient's care was started at 1511.   CSN: [id number]  Arrival date & time 05/21/13  1457   First MD Initiated Contact with Patient 05/21/13 1511      Chief Complaint  Patient presents with  . Abdominal Pain     The history is provided by the patient. No language interpreter was used.   HPI Comments: Scott Brock is a 32 y.o. male who presents to the Emergency Department complaining of 1 days of gradual onset, gradually worsening, constant, migratory, colicky, severe abdominal pain described as sharp and is currently localized on his left side. He reports associated dysuria and frequency. He states this pain feels similar to prior kidney stones and his last CT scan for kidney stones was 2 years ago. He denies chronic abdominal pain, chronic back pain, nausea, vomiting, diarrhea, testicular pain, numbness, fever, rash, CP, SOB or any other pain. Pt denies taking OTC medications at home to improve symptoms. He has a history of acid reflux but states he does not currently take medication for it. He previously had his esophagus dilated but does not take any medication for it.   Past Medical History  Diagnosis Date  . Status post dilation of esophageal narrowing   . Chronic back pain   . Renal disorder   . Asthma     Past Surgical History  Procedure Laterality Date  . Upper endoscopy w/ esophageal manometry    . Rhinoplasty      No family history on file.  History  Substance Use Topics  . Smoking status: Never Smoker   . Smokeless tobacco: Never Used  . Alcohol Use: No      Review of Systems  Constitutional: Negative for fever.  Respiratory: Negative for shortness of breath.   Cardiovascular: Negative for chest pain.  Gastrointestinal: Positive for abdominal pain. Negative for nausea, vomiting and diarrhea.  Genitourinary: Positive for  dysuria and frequency. Negative for testicular pain.  Skin: Negative for rash.  Neurological: Negative for numbness.  All other systems reviewed and are negative.    Allergies  Review of patient's allergies indicates no known allergies.  Home Medications   Current Outpatient Rx  Name  Route  Sig  Dispense  Refill  . amphetamine-dextroamphetamine (ADDERALL XR) 30 MG 24 hr capsule   Oral   Take 30 mg by mouth every morning.           . Fluticasone-Salmeterol (ADVAIR) 250-50 MCG/DOSE AEPB   Inhalation   Inhale 1 puff into the lungs every 12 (twelve) hours.           Marland Kitchen ibuprofen (ADVIL,MOTRIN) 800 MG tablet   Oral   Take 800 mg by mouth every 8 (eight) hours as needed. For pain          . omeprazole (PRILOSEC) 20 MG capsule   Oral   Take 40 mg by mouth daily as needed. For acid reflux          . oxyCODONE (OXYCONTIN) 20 MG 12 hr tablet   Oral   Take 20 mg by mouth every 12 (twelve) hours as needed. For pain          . oxycodone (OXYCONTIN) 30 MG TB12   Oral   Take 30 mg by mouth 4 (four) times daily as needed. For pain          .  oxyCODONE-acetaminophen (PERCOCET) 5-325 MG per tablet   Oral   Take 2 tablets by mouth every 6 (six) hours as needed for pain.   6 tablet   0   . valACYclovir (VALTREX) 1000 MG tablet   Oral   Take 1,000-2,000 mg by mouth 2 (two) times daily.             BP 133/95  Pulse 86  Temp(Src) 98.9 F (37.2 C) (Oral)  Resp 20  Wt 200 lb (90.719 kg)  BMI 26.39 kg/m2  SpO2 98%  Physical Exam  Nursing note and vitals reviewed. Constitutional:  Awake, alert, nontoxic appearance.  HENT:  Head: Atraumatic.  Eyes: Right eye exhibits no discharge. Left eye exhibits no discharge.  Neck: Neck supple.  Cardiovascular: Normal rate, regular rhythm and normal heart sounds.  Exam reveals no gallop and no friction rub.   No murmur heard. Pulmonary/Chest: Effort normal and breath sounds normal. No respiratory distress. He exhibits no  tenderness.  Abdominal: Soft. There is tenderness. There is no rebound.  Mild to moderate tenderness to palpation of entire left side of abdomen. Right side is non tender.  Genitourinary:  Testicles non tender. No palpable inguinal hernia.   Musculoskeletal: He exhibits no tenderness.  Baseline ROM, no obvious new focal weakness. No CVA tenderness. No midline back tenderness.  Neurological:  Mental status and motor strength appears baseline for patient and situation.  Skin: No rash noted.  Psychiatric: He has a normal mood and affect.    ED Course  Procedures (including critical care time) DIAGNOSTIC STUDIES: Oxygen Saturation is 98% on RA, normal by my interpretation.    COORDINATION OF CARE: 3:27 PM Patient / Family / Caregiver understand and agree with initial ED impression and plan with expectations set for ED visit. Will give IV fluids, Dilaudid, Toradol, as well as obtain urinalysis and CT scan to look for left sided kidney stone.   4:38 PM-Pt rechecked and still complains of pain with medications listed above. Informed of negative CT. Exam is unchanged. Symptoms are unchanged. Will order a CT scan with contrast due to initial unexplained Sxs and initial Ct result, as well as order labs.  Patient / Family / Caregiver informed of clinical course, understand medical decision-making process, and agree with plan.   1935: Patient stable, pain still present but somewhat improved, minimal tenderness to the left side of the abdomen on reexamination with no tenderness the right side of the abdomen no rebound tenderness. Patient informed of clinical course, understand medical decision-making process, and agree with plan.     Labs Reviewed  URINALYSIS, ROUTINE W REFLEX MICROSCOPIC - Abnormal; Notable for the following:    APPearance CLOUDY (*)    Bilirubin Urine SMALL (*)    Ketones, ur 40 (*)    All other components within normal limits  CBC WITH DIFFERENTIAL - Abnormal; Notable for the  following:    WBC 11.7 (*)    MCHC 36.2 (*)    All other components within normal limits  COMPREHENSIVE METABOLIC PANEL - Abnormal; Notable for the following:    Glucose, Bld 112 (*)    All other components within normal limits  LIPASE, BLOOD   Ct Abdomen Pelvis Wo Contrast  05/21/2013   *RADIOLOGY REPORT*  Clinical Data: Left flank pain.  Left abdominal pain.  History of kidney stones.  CT ABDOMEN AND PELVIS WITHOUT CONTRAST  Technique:  Multidetector CT imaging of the abdomen and pelvis was performed following the standard protocol without intravenous  contrast.  Comparison: 10/28/2010  Findings: Lung bases are clear.  No pleural or pericardial fluid. The liver appears normal without contrast.  No calcified gallstones.  The spleen is normal.  The pancreas is normal.  The adrenal glands are normal.  The kidneys are normal.  No cyst, mass, stone or hydronephrosis.  No stones seen along the course of either ureter. No bowel pathology is definitely evident.  There are a few loops of jejunum that are slightly prominent but as an isolated finding this is not felt to be significant.  I considered the possibility that this could represent a short segment jejunal intussusception.  I discussed this with an additional radiologist and our suspicion is low.  If the patient's symptoms seem likely could be gastrointestinal, this is not completely excluded however. Bladder, prostate gland and seminal vesicles are unremarkable.  No bony abnormalities seen.  IMPRESSION: No urinary tract stone disease.  A few loops of prominent jejunum.  I considered the possibility of jejunal intussusception.  In discussing this with an additional radiologist, our suspicion is low.  If the patient had a clinical picture of bowel pathology instead of urinary tract pathology, that diagnosis could not be excluded by the images that we have presently.  If clinical concern is high, the patient could be given oral contrast and repeat scanning of  the upper abdomen could be performed.  I am not strongly advocating that, if the clinical presentation is benign.   Original Report Authenticated By: Paulina Fusi, M.D.   Ct Abdomen Pelvis W Contrast  05/21/2013   *RADIOLOGY REPORT*  Clinical Data: Abdominal pain and prominent loop of jejunum in the left abdomen by prior unenhanced CT earlier today.  Contrast enhanced study is now performed with IV and oral contrast.  CT ABDOMEN AND PELVIS WITH CONTRAST  Technique:  Multidetector CT imaging of the abdomen and pelvis was performed following the standard protocol during bolus administration of intravenous contrast.  Contrast: 50mL OMNIPAQUE IOHEXOL 300 MG/ML SOLN, OMNIPAQUE IOHEXOL 300 MG/ML  SOLN  Comparison: Unenhanced scan earlier today and prior CT on 10/28/2010.  Findings: There is no evidence of bowel obstruction, intussusception or ileus.  Previous appearance simulating dilated jejunum actually represents multiple loops of jejunum in close proximity to one another and individual jejunal loops show no evidence of significant dilatation.  By the time the scan was performed, oral contrast has reached the proximal colon.  The appendix is well visualized and normal in appearance.  There is no evidence of inflammatory process, free fluid or abscess no masses or enlarged lymph nodes are identified.  No evidence of hernias.  The liver, gallbladder, pancreas, spleen, adrenal glands and kidneys are within normal limits.  The bladder is unremarkable.  IMPRESSION: No evidence of bowel obstruction or significant ileus.  With oral contrast present, the jejunum appears unremarkable.   Original Report Authenticated By: Irish Lack, M.D.     1. Abdominal pain       MDM  I doubt any other [facility] precluding discharge at this time including, but not necessarily limited to the following: SBI, SBO, peritonitis.      I personally performed the services described in this documentation, which was scribed in my  presence. The recorded information has been reviewed and is accurate.    Scott Horn, MD 05/21/13 2110

## 2013-05-21 NOTE — ED Notes (Signed)
Epigastric pain. States he feels like he has glass in his stomach. Drove himself here.

## 2013-05-21 NOTE — ED Notes (Signed)
Per pt. He will call his wife to come and get him so he can have pain meds.

## 2013-05-23 ENCOUNTER — Encounter (HOSPITAL_BASED_OUTPATIENT_CLINIC_OR_DEPARTMENT_OTHER): Payer: Self-pay | Admitting: *Deleted

## 2013-05-23 ENCOUNTER — Emergency Department (HOSPITAL_BASED_OUTPATIENT_CLINIC_OR_DEPARTMENT_OTHER)
Admission: EM | Admit: 2013-05-23 | Discharge: 2013-05-23 | Disposition: A | Discharge status: Home / Self Care | Attending: Emergency Medicine | Admitting: Emergency Medicine

## 2013-05-23 DIAGNOSIS — R112 Nausea with vomiting, unspecified: Secondary | ICD-10-CM | POA: Insufficient documentation

## 2013-05-23 DIAGNOSIS — Z9889 Other specified postprocedural states: Secondary | ICD-10-CM | POA: Insufficient documentation

## 2013-05-23 DIAGNOSIS — Z87448 Personal history of other diseases of urinary system: Secondary | ICD-10-CM | POA: Insufficient documentation

## 2013-05-23 DIAGNOSIS — G8929 Other chronic pain: Secondary | ICD-10-CM | POA: Insufficient documentation

## 2013-05-23 DIAGNOSIS — R109 Unspecified abdominal pain: Secondary | ICD-10-CM | POA: Insufficient documentation

## 2013-05-23 DIAGNOSIS — IMO0002 Reserved for concepts with insufficient information to code with codable children: Secondary | ICD-10-CM | POA: Insufficient documentation

## 2013-05-23 DIAGNOSIS — J45909 Unspecified asthma, uncomplicated: Secondary | ICD-10-CM | POA: Insufficient documentation

## 2013-05-23 DIAGNOSIS — Z79899 Other long term (current) drug therapy: Secondary | ICD-10-CM | POA: Insufficient documentation

## 2013-05-23 LAB — URINALYSIS, ROUTINE W REFLEX MICROSCOPIC
Bilirubin Urine: NEGATIVE
Ketones, ur: NEGATIVE mg/dL
Nitrite: NEGATIVE
Protein, ur: NEGATIVE mg/dL
Urobilinogen, UA: 0.2 mg/dL (ref 0.0–1.0)

## 2013-05-23 MED ORDER — KETOROLAC TROMETHAMINE 30 MG/ML IJ SOLN
30.0000 mg | Freq: Once | INTRAMUSCULAR | Status: AC
  Administered 2013-05-23: 30 mg via INTRAVENOUS
  Filled 2013-05-23: qty 1

## 2013-05-23 MED ORDER — FENTANYL CITRATE 0.05 MG/ML IJ SOLN
50.0000 ug | Freq: Once | INTRAMUSCULAR | Status: AC
  Administered 2013-05-23: 50 ug via INTRAVENOUS
  Filled 2013-05-23: qty 2

## 2013-05-23 MED ORDER — ONDANSETRON HCL 4 MG/2ML IJ SOLN
4.0000 mg | Freq: Once | INTRAMUSCULAR | Status: AC
  Administered 2013-05-23: 4 mg via INTRAVENOUS
  Filled 2013-05-23: qty 2

## 2013-05-23 MED ORDER — HYDROMORPHONE HCL PF 1 MG/ML IJ SOLN
1.0000 mg | Freq: Once | INTRAMUSCULAR | Status: AC
  Administered 2013-05-23: 1 mg via INTRAVENOUS
  Filled 2013-05-23: qty 1

## 2013-05-23 MED ORDER — HYDROMORPHONE HCL PF 1 MG/ML IJ SOLN
0.5000 mg | Freq: Once | INTRAMUSCULAR | Status: AC
  Administered 2013-05-23: 0.5 mg via INTRAVENOUS
  Filled 2013-05-23: qty 1

## 2013-05-23 MED ORDER — OXYCODONE-ACETAMINOPHEN 5-325 MG PO TABS: 1.0000 | ORAL_TABLET | Freq: Four times a day (QID) | ORAL | Status: DC | PRN

## 2013-05-23 NOTE — ED Notes (Signed)
Pt still unable to provide urine sample 

## 2013-05-23 NOTE — ED Provider Notes (Signed)
Medical screening examination/treatment/procedure(s) were performed by non-physician practitioner and as supervising physician I was immediately available for consultation/collaboration.   Gwyneth Sprout, MD 05/23/13 [phone]

## 2013-05-23 NOTE — ED Provider Notes (Signed)
History     CSN: [id number]  Arrival date & time 05/23/13  1618   First MD Initiated Contact with Patient 05/23/13 1808      Chief Complaint  Patient presents with  . Abdominal Pain    (Consider location/radiation/quality/duration/timing/severity/associated sxs/prior treatment) Patient is a 32 y.o. male presenting with abdominal pain. The history is provided by the patient. No language interpreter was used.  Abdominal Pain The current episode started in the past 7 days. Associated symptoms include abdominal pain, nausea and vomiting. Pertinent negatives include no chills or fever. Associated symptoms comments: He complains of left sided abdominal pain that feels like his kidney stones that he has had in the past. He was seen recently in the ED and has scheduled follow up with the urologist he states he has seen before, however, he does not remember whether this urologist is in [facility] [place]. No fever. Nausea without vomiting. .    Past Medical History  Diagnosis Date  . Status post dilation of esophageal narrowing   . Chronic back pain   . Renal disorder   . Asthma     Past Surgical History  Procedure Laterality Date  . Upper endoscopy w/ esophageal manometry    . Rhinoplasty      History reviewed. No pertinent family history.  History  Substance Use Topics  . Smoking status: Never Smoker   . Smokeless tobacco: Never Used  . Alcohol Use: No      Review of Systems  Constitutional: Negative for fever and chills.  Gastrointestinal: Positive for nausea, vomiting and abdominal pain.  Genitourinary: Positive for flank pain. Negative for testicular pain.  Musculoskeletal: Negative.   Skin: Negative.     Allergies  Review of patient's allergies indicates no known allergies.  Home Medications   Current Outpatient Rx  Name  Route  Sig  Dispense  Refill  . amphetamine-dextroamphetamine (ADDERALL XR) 30 MG 24 hr capsule   Oral   Take 30 mg by mouth every  morning.           . Fluticasone-Salmeterol (ADVAIR) 250-50 MCG/DOSE AEPB   Inhalation   Inhale 1 puff into the lungs every 12 (twelve) hours.           Marland Kitchen ibuprofen (ADVIL,MOTRIN) 800 MG tablet   Oral   Take 800 mg by mouth every 8 (eight) hours as needed. For pain          . omeprazole (PRILOSEC) 20 MG capsule   Oral   Take 40 mg by mouth daily as needed. For acid reflux          . oxyCODONE (OXYCONTIN) 20 MG 12 hr tablet   Oral   Take 20 mg by mouth every 12 (twelve) hours as needed. For pain          . oxycodone (OXYCONTIN) 30 MG TB12   Oral   Take 30 mg by mouth 4 (four) times daily as needed. For pain          . oxyCODONE-acetaminophen (PERCOCET) 5-325 MG per tablet   Oral   Take 2 tablets by mouth every 6 (six) hours as needed for pain.   6 tablet   0   . valACYclovir (VALTREX) 1000 MG tablet   Oral   Take 1,000-2,000 mg by mouth 2 (two) times daily.             BP 98/64  Pulse 92  Resp 20  SpO2 93%  Physical Exam  Constitutional: He is oriented to person, place, and time. He appears well-developed and well-nourished. No distress.  Pulmonary/Chest: Effort normal.  Abdominal: Soft. There is no tenderness. There is no rebound and no guarding.  Genitourinary:  Mild CVA tenderness on the left.  Neurological: He is alert and oriented to person, place, and time.  Skin: Skin is warm and dry.  Psychiatric: He has a normal mood and affect.    ED Course  Procedures (including critical care time)  Labs Reviewed  URINALYSIS, ROUTINE W REFLEX MICROSCOPIC   No results found.   No diagnosis found. 1. Flank pain, no CT evidence of kidney/ureteral stone   MDM  Temporary relief with Dilaudid and Toradol. He has Percocet at home. Review of patient's chart shows CT scan abd/pel x2, one with and one without contrast media. No stones seen, no other abnormalities visualized. No need to repeat studies. Patient is stable for discharge and is encouraged to  follow up with his scheduled urology appointment when he remembers which one he is scheduled with.        Arnoldo Hooker, PA-C 05/23/13 2039

## 2013-05-23 NOTE — ED Notes (Signed)
Patient is resting comfortably. 

## 2013-05-23 NOTE — ED Notes (Signed)
Pt requesting additional pain medication. PA aware.  

## 2013-05-23 NOTE — ED Notes (Signed)
PA at bedside.

## 2013-05-23 NOTE — ED Notes (Signed)
Pt seen here on 6/9 for same ct done , Percocet not working for his pain , here today for cont abd and nausea

## 2013-05-23 NOTE — ED Notes (Signed)
Pt requesting rx for percocet. PA made aware and in room to talk with pt. now.

## 2013-05-26 ENCOUNTER — Emergency Department (HOSPITAL_BASED_OUTPATIENT_CLINIC_OR_DEPARTMENT_OTHER)
Admission: EM | Admit: 2013-05-26 | Discharge: 2013-05-26 | Disposition: A | Discharge status: Home / Self Care | Attending: Emergency Medicine | Admitting: Emergency Medicine

## 2013-05-26 ENCOUNTER — Encounter (HOSPITAL_BASED_OUTPATIENT_CLINIC_OR_DEPARTMENT_OTHER): Payer: Self-pay | Admitting: *Deleted

## 2013-05-26 DIAGNOSIS — J45909 Unspecified asthma, uncomplicated: Secondary | ICD-10-CM | POA: Insufficient documentation

## 2013-05-26 DIAGNOSIS — Z79899 Other long term (current) drug therapy: Secondary | ICD-10-CM | POA: Insufficient documentation

## 2013-05-26 DIAGNOSIS — Z87448 Personal history of other diseases of urinary system: Secondary | ICD-10-CM | POA: Insufficient documentation

## 2013-05-26 DIAGNOSIS — F10920 Alcohol use, unspecified with intoxication, uncomplicated: Secondary | ICD-10-CM

## 2013-05-26 DIAGNOSIS — G8929 Other chronic pain: Secondary | ICD-10-CM | POA: Insufficient documentation

## 2013-05-26 DIAGNOSIS — K2970 Gastritis, unspecified, without bleeding: Secondary | ICD-10-CM | POA: Insufficient documentation

## 2013-05-26 DIAGNOSIS — R110 Nausea: Secondary | ICD-10-CM | POA: Insufficient documentation

## 2013-05-26 DIAGNOSIS — F1010 Alcohol abuse, uncomplicated: Secondary | ICD-10-CM | POA: Insufficient documentation

## 2013-05-26 DIAGNOSIS — M549 Dorsalgia, unspecified: Secondary | ICD-10-CM | POA: Insufficient documentation

## 2013-05-26 DIAGNOSIS — Z9889 Other specified postprocedural states: Secondary | ICD-10-CM | POA: Insufficient documentation

## 2013-05-26 LAB — CBC WITH DIFFERENTIAL/PLATELET
Basophils Relative: 0 % (ref 0–1)
Eosinophils Absolute: 1.1 10*3/uL — ABNORMAL HIGH (ref 0.0–0.7)
Hemoglobin: 17.3 g/dL — ABNORMAL HIGH (ref 13.0–17.0)
MCH: 33 pg (ref 26.0–34.0)
MCHC: 36.1 g/dL — ABNORMAL HIGH (ref 30.0–36.0)
Monocytes Relative: 6 % (ref 3–12)
Neutrophils Relative %: 40 % — ABNORMAL LOW (ref 43–77)
Platelets: 256 10*3/uL (ref 150–400)
RDW: 12 % (ref 11.5–15.5)

## 2013-05-26 LAB — ETHANOL: Alcohol, Ethyl (B): 140 mg/dL — ABNORMAL HIGH (ref 0–11)

## 2013-05-26 LAB — COMPREHENSIVE METABOLIC PANEL
Albumin: 4.4 g/dL (ref 3.5–5.2)
Alkaline Phosphatase: 116 U/L (ref 39–117)
BUN: 7 mg/dL (ref 6–23)
Potassium: 3.5 mEq/L (ref 3.5–5.1)
Total Protein: 7.5 g/dL (ref 6.0–8.3)

## 2013-05-26 LAB — LIPASE, BLOOD: Lipase: 20 U/L (ref 11–59)

## 2013-05-26 MED ORDER — OMEPRAZOLE 20 MG PO CPDR: 40.0000 mg | DELAYED_RELEASE_CAPSULE | Freq: Every day | ORAL | Status: DC | PRN

## 2013-05-26 MED ORDER — HYDROMORPHONE HCL PF 1 MG/ML IJ SOLN
1.0000 mg | Freq: Once | INTRAMUSCULAR | Status: AC
  Administered 2013-05-26: 1 mg via INTRAVENOUS
  Filled 2013-05-26: qty 1

## 2013-05-26 MED ORDER — ONDANSETRON HCL 4 MG/2ML IJ SOLN
4.0000 mg | Freq: Once | INTRAMUSCULAR | Status: AC
  Administered 2013-05-26: 4 mg via INTRAVENOUS
  Filled 2013-05-26: qty 2

## 2013-05-26 MED ORDER — SODIUM CHLORIDE 0.9 % IV BOLUS (SEPSIS)
1000.0000 mL | Freq: Once | INTRAVENOUS | Status: AC
  Administered 2013-05-26: 1000 mL via INTRAVENOUS

## 2013-05-26 NOTE — ED Provider Notes (Signed)
History     This chart was scribed for Charles B. Bernette Mayers, MD by Jiles Prows, ED Scribe. The patient was seen in room MH08/MH08 and the patient's care was started at 4:41 PM.  CSN: [id number]  Arrival date & time 05/26/13  1604   Chief Complaint  Patient presents with  . Abdominal Pain     The history is provided by the patient and medical records. No language interpreter was used.   HPI Comments: Scott Brock is a 32 y.o. male with a h/o chronic back pain, and asthma who presents to the Emergency Department complaining of moderate to severe constant L upper abdominal pain onset 1 week ago.  He describes this pain as feeling like glass.  He reports associated nausea but denies vomiting.  Pt states that this pain is similar to pain from previous kidney stones . Seen in the ED about a week ago for same and had neg CT. Seen again 3 days ago and given additional percocet Rx.  He claims that he saw a urologist earlier this week who is waiting for records from ED before treating pt. Although he 'can't remember where the doctor's office is'.  Pt denies headache, diaphoresis, fever, chills, diarrhea, weakness, cough, SOB and any other pain.   Pt reports he was on narcotics for about 3 years for back pain.  He reports that he feels better since he stopped voluntarily in January however [place] Controlled Substance database shows he was getting Rx up to March 2014.   Past Medical History  Diagnosis Date  . Status post dilation of esophageal narrowing   . Chronic back pain   . Renal disorder   . Asthma     Past Surgical History  Procedure Laterality Date  . Upper endoscopy w/ esophageal manometry    . Rhinoplasty      History reviewed. No pertinent family history.  History  Substance Use Topics  . Smoking status: Never Smoker   . Smokeless tobacco: Never Used  . Alcohol Use: No      Review of Systems  Constitutional: Negative for fever and chills.  HENT: Negative for sore throat and  mouth sores.   Respiratory: Negative for cough and shortness of breath.   Gastrointestinal: Positive for nausea and abdominal pain. Negative for vomiting and diarrhea.  Skin: Negative for rash and wound.  Neurological: Negative for seizures and syncope.   A complete 10 system review of systems was obtained and all systems are negative except as noted in the HPI and PMH.   Allergies  Review of patient's allergies indicates no known allergies.  Home Medications   Current Outpatient Rx  Name  Route  Sig  Dispense  Refill  . amphetamine-dextroamphetamine (ADDERALL XR) 30 MG 24 hr capsule   Oral   Take 30 mg by mouth every morning.           . Fluticasone-Salmeterol (ADVAIR) 250-50 MCG/DOSE AEPB   Inhalation   Inhale 1 puff into the lungs every 12 (twelve) hours.           Marland Kitchen ibuprofen (ADVIL,MOTRIN) 800 MG tablet   Oral   Take 800 mg by mouth every 8 (eight) hours as needed. For pain          . omeprazole (PRILOSEC) 20 MG capsule   Oral   Take 40 mg by mouth daily as needed. For acid reflux          . oxyCODONE (OXYCONTIN) 20 MG 12 hr  tablet   Oral   Take 20 mg by mouth every 12 (twelve) hours as needed. For pain          . oxycodone (OXYCONTIN) 30 MG TB12   Oral   Take 30 mg by mouth 4 (four) times daily as needed. For pain          . oxyCODONE-acetaminophen (PERCOCET) 5-325 MG per tablet   Oral   Take 1-2 tablets by mouth every 6 (six) hours as needed for pain.   6 tablet   0   . valACYclovir (VALTREX) 1000 MG tablet   Oral   Take 1,000-2,000 mg by mouth 2 (two) times daily.             BP 122/81  Pulse 116  Temp(Src) 99.1 F (37.3 C) (Oral)  Resp 20  Ht 6\' 1"  (1.854 m)  Wt 200 lb (90.719 kg)  BMI 26.39 kg/m2  SpO2 98%  Physical Exam  Nursing note and vitals reviewed. Constitutional: He is oriented to person, place, and time. He appears well-developed and well-nourished.  HENT:  Head: Normocephalic and atraumatic.  Eyes: EOM are normal.  Pupils are equal, round, and reactive to light.  Neck: Normal range of motion. Neck supple.  Cardiovascular: Normal rate, normal heart sounds and intact distal pulses.   Pulmonary/Chest: Effort normal and breath sounds normal.  Abdominal: Bowel sounds are normal. He exhibits no distension. There is tenderness (LUQ). There is no rebound and no guarding.  Musculoskeletal: Normal range of motion. He exhibits no edema and no tenderness.  Neurological: He is alert and oriented to person, place, and time. He has normal strength. No cranial nerve deficit or sensory deficit.  Skin: Skin is warm and dry. No rash noted.  Psychiatric: He has a normal mood and affect.    ED Course  Procedures (including critical care time) DIAGNOSTIC STUDIES: Oxygen Saturation is 98% on RA, normal by my interpretation.    COORDINATION OF CARE: 4:46 PM - Discussed ED treatment with pt at bedside including pain management and IV fluids and pt agrees. Suggested follow up with stomach specialist.    Labs Reviewed  CBC WITH DIFFERENTIAL - Abnormal; Notable for the following:    Hemoglobin 17.3 (*)    MCHC 36.1 (*)    Neutrophils Relative % 40 (*)    Lymphs Abs 4.6 (*)    Eosinophils Relative 11 (*)    Eosinophils Absolute 1.1 (*)    All other components within normal limits  COMPREHENSIVE METABOLIC PANEL - Abnormal; Notable for the following:    Glucose, Bld 131 (*)    ALT 57 (*)    All other components within normal limits  ETHANOL - Abnormal; Notable for the following:    Alcohol, Ethyl (B) 140 (*)    All other components within normal limits  LIPASE, BLOOD   No results found.   1. Alcohol intoxication, uncomplicated   2. Gastritis       MDM  Pt initially denied EtOH use but when confronted with lab results states he had 'one glass of wine'. He was advised to avoid any further EtOH use. Suspect alcoholic gastritis vs malingering. Advised no further ED Narocotic Rx. Advised GI followup.       I  personally performed the services described in this documentation, which was scribed in my presence. The recorded information has been reviewed and is accurate.     Charles B. Bernette Mayers, MD 05/26/13 1725

## 2013-05-26 NOTE — ED Notes (Signed)
Pt reports still unable to void at this time.

## 2013-05-26 NOTE — ED Notes (Signed)
Pt c/o LUQ pain x 1 week. Hx stones. "feels same"

## 2014-01-20 ENCOUNTER — Encounter (HOSPITAL_BASED_OUTPATIENT_CLINIC_OR_DEPARTMENT_OTHER): Payer: Self-pay | Admitting: Emergency Medicine

## 2014-01-20 ENCOUNTER — Emergency Department (HOSPITAL_BASED_OUTPATIENT_CLINIC_OR_DEPARTMENT_OTHER)

## 2014-01-20 ENCOUNTER — Emergency Department (HOSPITAL_BASED_OUTPATIENT_CLINIC_OR_DEPARTMENT_OTHER)
Admission: EM | Admit: 2014-01-20 | Discharge: 2014-01-20 | Disposition: A | Discharge status: Home / Self Care | Attending: Emergency Medicine | Admitting: Emergency Medicine

## 2014-01-20 DIAGNOSIS — J45901 Unspecified asthma with (acute) exacerbation: Secondary | ICD-10-CM | POA: Insufficient documentation

## 2014-01-20 DIAGNOSIS — L299 Pruritus, unspecified: Secondary | ICD-10-CM | POA: Insufficient documentation

## 2014-01-20 DIAGNOSIS — G8929 Other chronic pain: Secondary | ICD-10-CM | POA: Insufficient documentation

## 2014-01-20 DIAGNOSIS — J111 Influenza due to unidentified influenza virus with other respiratory manifestations: Secondary | ICD-10-CM | POA: Insufficient documentation

## 2014-01-20 DIAGNOSIS — F17200 Nicotine dependence, unspecified, uncomplicated: Secondary | ICD-10-CM | POA: Insufficient documentation

## 2014-01-20 DIAGNOSIS — Z9889 Other specified postprocedural states: Secondary | ICD-10-CM | POA: Insufficient documentation

## 2014-01-20 DIAGNOSIS — Z87448 Personal history of other diseases of urinary system: Secondary | ICD-10-CM | POA: Insufficient documentation

## 2014-01-20 MED ORDER — IPRATROPIUM-ALBUTEROL 0.5-2.5 (3) MG/3ML IN SOLN
3.0000 mL | Freq: Once | RESPIRATORY_TRACT | Status: AC
  Administered 2014-01-20: 3 mL via RESPIRATORY_TRACT
  Filled 2014-01-20: qty 3

## 2014-01-20 MED ORDER — PREDNISONE 10 MG PO TABS: 20.0000 mg | ORAL_TABLET | Freq: Every day | ORAL | Status: DC

## 2014-01-20 MED ORDER — PREDNISONE 20 MG PO TABS
40.0000 mg | ORAL_TABLET | Freq: Once | ORAL | Status: AC
  Administered 2014-01-20: 40 mg via ORAL
  Filled 2014-01-20: qty 2

## 2014-01-20 MED ORDER — GUAIFENESIN-CODEINE 100-10 MG/5ML PO SYRP: 5.0000 mL | ORAL_SOLUTION | Freq: Three times a day (TID) | ORAL | Status: DC | PRN

## 2014-01-20 MED ORDER — GUAIFENESIN-CODEINE 100-10 MG/5ML PO SOLN
5.0000 mL | Freq: Once | ORAL | Status: AC
  Administered 2014-01-20: 5 mL via ORAL
  Filled 2014-01-20: qty 5

## 2014-01-20 MED ORDER — ALBUTEROL SULFATE HFA 108 (90 BASE) MCG/ACT IN AERS
2.0000 | INHALATION_SPRAY | RESPIRATORY_TRACT | Status: DC | PRN
  Administered 2014-01-20: 2 via RESPIRATORY_TRACT
  Filled 2014-01-20: qty 6.7

## 2014-01-20 NOTE — ED Notes (Signed)
The patient refuses to put a gown on. He stated no that he was not putting it on.

## 2014-01-20 NOTE — ED Notes (Signed)
Discussed with pt that he is receiving codeine cough medicine. States he has a ride home.

## 2014-01-20 NOTE — Discharge Instructions (Signed)
Be sure you are drinking plenty of fluids to prevent dehydration. Take tylenol and ibuprofen as needed for fever and body aches. Do not take the cough medication if you are driving as it will make you sleepy. Follow up with your doctor or return here as needed for worsening symptoms.

## 2014-01-20 NOTE — ED Notes (Signed)
Patient here with cough, congestion, and general body aches for 2 weeks. Had z-pack last week with no relief and now feels worse.

## 2014-01-20 NOTE — ED Provider Notes (Signed)
CSN: [id number]     Arrival date & time 01/20/14  1657 History   First MD Initiated Contact with Patient 01/20/14 2002     Chief Complaint  Patient presents with  . Cough  . Nasal Congestion   (Consider location/radiation/quality/duration/timing/severity/associated sxs/prior Treatment) Patient is a 33 y.o. male presenting with cough. The history is provided by the patient.  Cough Cough characteristics:  Productive Sputum characteristics:  Green Severity:  Moderate Onset quality:  Gradual Duration:  3 weeks Timing:  Constant Progression:  Worsening Chronicity:  New Smoker: yes   Context: sick contacts   Relieved by:  Nothing Worsened by:  Lying down, smoking, deep breathing and activity Ineffective treatments:  Cough suppressants Associated symptoms: chills, myalgias, rhinorrhea, sinus congestion and wheezing   Associated symptoms: no ear pain, no eye discharge, no headaches and no rash   Risk factors: no recent travel    Scott Brock is a 33 y.o. male who presents to the ED with cough, cold and congestion that started over 3 weeks ago. He went to PCP a couple weeks ago and was treated with a Z-Pack. Patient has gotten no better and  Really feels worse since he was treated. He complains of feeling short of breath.    Past Medical History  Diagnosis Date  . Status post dilation of esophageal narrowing   . Chronic back pain   . Renal disorder   . Asthma    Past Surgical History  Procedure Laterality Date  . Upper endoscopy w/ esophageal manometry    . Rhinoplasty     No family history on file. History  Substance Use Topics  . Smoking status: Current Every Day Smoker -- 0.50 packs/day    Types: Cigarettes  . Smokeless tobacco: Never Used  . Alcohol Use: No    Review of Systems  Constitutional: Positive for chills.  HENT: Positive for rhinorrhea and sneezing. Negative for ear pain.   Eyes: Positive for itching. Negative for discharge.  Respiratory: Positive for  cough and wheezing.   Gastrointestinal: Negative for nausea, vomiting and abdominal pain.  Musculoskeletal: Positive for myalgias.  Skin: Negative for rash.  Neurological: Negative for dizziness and headaches.    Allergies  Review of patient's allergies indicates no known allergies.  Home Medications   Current Outpatient Rx  Name  Route  Sig  Dispense  Refill  . Fluticasone-Salmeterol (ADVAIR) 250-50 MCG/DOSE AEPB   Inhalation   Inhale 1 puff into the lungs every 12 (twelve) hours.           Marland Kitchen omeprazole (PRILOSEC) 20 MG capsule   Oral   Take 2 capsules (40 mg total) by mouth daily as needed. For acid reflux   30 capsule   0    BP 126/63  Pulse 90  Temp(Src) 98.9 F (37.2 C) (Oral)  Resp 18  SpO2 96% Physical Exam  Nursing note and vitals reviewed. Constitutional: He is oriented to person, place, and time. He appears well-developed and well-nourished. No distress.  HENT:  Head: Normocephalic and atraumatic.  Eyes: Conjunctivae and EOM are normal. Pupils are equal, round, and reactive to light.  Neck: Normal range of motion. Neck supple.  Cardiovascular: Normal rate and regular rhythm.   Pulmonary/Chest: Effort normal. No respiratory distress. He has wheezes.  Inspiratory and expiratory wheezing heard bilateral  Abdominal: Soft. Bowel sounds are normal. There is no tenderness.  Musculoskeletal: Normal range of motion. He exhibits no edema.  Neurological: He is alert and oriented to person,  place, and time. No cranial nerve deficit.  Skin: Skin is warm and dry.  Psychiatric: He has a normal mood and affect. His behavior is normal.    ED Course  Procedures (including critical care time) Labs Review Labs Reviewed - No data to display Imaging Review Dg Chest 2 View  01/20/2014   CLINICAL DATA:  Cough.  Congestion.  General body aches for 2 weeks.  EXAM: CHEST  2 VIEW  COMPARISON:  12/11/2005  FINDINGS: Midline trachea. Normal heart size and mediastinal contours. No  pleural effusion or pneumothorax. Clear lungs.  IMPRESSION: Normal chest.   Electronically Signed   By: Jeronimo GreavesKyle  Talbot M.D.   On: 01/20/2014 17:44   2100 after albuterol/atrovent treatment, prednisone and Robitussin AC patient re examined. Better air movement and less wheezing. Stable for discharge without respiratory distress.    MDM  Scott Brock is a 33 y.o. male with bronchitis and flu like symptoms x 2 weeks. Treated with antibiotics by his PCP at onset of symptoms but has not improved. Will treat with prednisone, cough medication and albuterol inhaler. He will follow up with his PCP or return here as needed for worsening symptoms. He is stable for dischargge with normal chest x-ray, stable vital signs. BP 119/57  Pulse 95  Temp(Src) 98.5 F (36.9 C) (Oral)  Resp 16  SpO2 95%  Encouraged patient to stop smoking.    Medication List    TAKE these medications       guaiFENesin-codeine 100-10 MG/5ML syrup  Commonly known as:  ROBITUSSIN AC  Take 5 mLs by mouth 3 (three) times daily as needed for cough.     predniSONE 10 MG tablet  Commonly known as:  DELTASONE  Take 2 tablets (20 mg total) by mouth daily.      ASK your doctor about these medications       Fluticasone-Salmeterol 250-50 MCG/DOSE Aepb  Commonly known as:  ADVAIR  Inhale 1 puff into the lungs every 12 (twelve) hours.     omeprazole 20 MG capsule  Commonly known as:  PRILOSEC  Take 2 capsules (40 mg total) by mouth daily as needed. For acid reflux          Janne NapoleonHope M Neese, NP 01/20/14 2235

## 2014-01-22 ENCOUNTER — Emergency Department (HOSPITAL_BASED_OUTPATIENT_CLINIC_OR_DEPARTMENT_OTHER)

## 2014-01-22 ENCOUNTER — Emergency Department (HOSPITAL_BASED_OUTPATIENT_CLINIC_OR_DEPARTMENT_OTHER)
Admission: EM | Admit: 2014-01-22 | Discharge: 2014-01-22 | Disposition: A | Discharge status: Other Acute Inpatient Hospital | Attending: Emergency Medicine | Admitting: Emergency Medicine

## 2014-01-22 ENCOUNTER — Encounter (HOSPITAL_BASED_OUTPATIENT_CLINIC_OR_DEPARTMENT_OTHER): Payer: Self-pay | Admitting: Emergency Medicine

## 2014-01-22 DIAGNOSIS — Z9889 Other specified postprocedural states: Secondary | ICD-10-CM | POA: Insufficient documentation

## 2014-01-22 DIAGNOSIS — Z79899 Other long term (current) drug therapy: Secondary | ICD-10-CM | POA: Insufficient documentation

## 2014-01-22 DIAGNOSIS — Z87448 Personal history of other diseases of urinary system: Secondary | ICD-10-CM | POA: Insufficient documentation

## 2014-01-22 DIAGNOSIS — R0789 Other chest pain: Secondary | ICD-10-CM | POA: Insufficient documentation

## 2014-01-22 DIAGNOSIS — J45901 Unspecified asthma with (acute) exacerbation: Secondary | ICD-10-CM | POA: Insufficient documentation

## 2014-01-22 DIAGNOSIS — IMO0002 Reserved for concepts with insufficient information to code with codable children: Secondary | ICD-10-CM | POA: Insufficient documentation

## 2014-01-22 DIAGNOSIS — IMO0001 Reserved for inherently not codable concepts without codable children: Secondary | ICD-10-CM | POA: Insufficient documentation

## 2014-01-22 DIAGNOSIS — F17200 Nicotine dependence, unspecified, uncomplicated: Secondary | ICD-10-CM | POA: Insufficient documentation

## 2014-01-22 LAB — CBC WITH DIFFERENTIAL/PLATELET
Basophils Absolute: 0 10*3/uL (ref 0.0–0.1)
Basophils Relative: 0 % (ref 0–1)
EOS PCT: 0 % (ref 0–5)
Eosinophils Absolute: 0 10*3/uL (ref 0.0–0.7)
HEMATOCRIT: 42.2 % (ref 39.0–52.0)
HEMOGLOBIN: 14.7 g/dL (ref 13.0–17.0)
LYMPHS ABS: 3.2 10*3/uL (ref 0.7–4.0)
LYMPHS PCT: 33 % (ref 12–46)
MCH: 31.9 pg (ref 26.0–34.0)
MCHC: 34.8 g/dL (ref 30.0–36.0)
MCV: 91.5 fL (ref 78.0–100.0)
MONO ABS: 1 10*3/uL (ref 0.1–1.0)
MONOS PCT: 10 % (ref 3–12)
Neutro Abs: 5.6 10*3/uL (ref 1.7–7.7)
Neutrophils Relative %: 57 % (ref 43–77)
PLATELETS: 179 10*3/uL (ref 150–400)
RBC: 4.61 MIL/uL (ref 4.22–5.81)
RDW: 12.5 % (ref 11.5–15.5)
WBC: 9.9 10*3/uL (ref 4.0–10.5)

## 2014-01-22 LAB — BASIC METABOLIC PANEL
BUN: 15 mg/dL (ref 6–23)
CALCIUM: 8.3 mg/dL — AB (ref 8.4–10.5)
CO2: 21 meq/L (ref 19–32)
Chloride: 108 mEq/L (ref 96–112)
Creatinine, Ser: 1.2 mg/dL (ref 0.50–1.35)
GFR calc Af Amer: >90 mL/min (ref >90)
GFR calc non Af Amer: 79 mL/min — ABNORMAL LOW (ref >90)
GLUCOSE: 146 mg/dL — AB (ref 70–99)
Potassium: 3.1 mEq/L — ABNORMAL LOW (ref 3.7–5.3)
SODIUM: 144 meq/L (ref 137–147)

## 2014-01-22 LAB — POCT I-STAT 3, ART BLOOD GAS (G3+)
ACID-BASE DEFICIT: 3 mmol/L — AB (ref 0.0–2.0)
Bicarbonate: 21.1 mEq/L (ref 20.0–24.0)
O2 SAT: 82 %
PCO2 ART: 33.9 mmHg — AB (ref 35.0–45.0)
Patient temperature: 98.4
TCO2: 22 mmol/L (ref 0–100)
pH, Arterial: 7.402 (ref 7.350–7.450)
pO2, Arterial: 45 mmHg — ABNORMAL LOW (ref 80.0–100.0)

## 2014-01-22 LAB — TROPONIN I: Troponin I: <0.3 ng/mL (ref <0.30)

## 2014-01-22 MED ORDER — ALBUTEROL (5 MG/ML) CONTINUOUS INHALATION SOLN
15.0000 mg/h | INHALATION_SOLUTION | RESPIRATORY_TRACT | Status: DC
  Administered 2014-01-22: 15 mg/h via RESPIRATORY_TRACT
  Filled 2014-01-22: qty 20

## 2014-01-22 MED ORDER — MAGNESIUM SULFATE 50 % IJ SOLN
2.0000 g | Freq: Once | INTRAMUSCULAR | Status: AC
  Administered 2014-01-22: 2 g via INTRAVENOUS
  Filled 2014-01-22: qty 4

## 2014-01-22 MED ORDER — ALBUTEROL (5 MG/ML) CONTINUOUS INHALATION SOLN
INHALATION_SOLUTION | RESPIRATORY_TRACT | Status: AC
  Filled 2014-01-22: qty 20

## 2014-01-22 MED ORDER — IPRATROPIUM BROMIDE 0.02 % IN SOLN
RESPIRATORY_TRACT | Status: AC
  Filled 2014-01-22: qty 2.5

## 2014-01-22 MED ORDER — IPRATROPIUM BROMIDE 0.02 % IN SOLN: 0.5000 mg | Freq: Once | RESPIRATORY_TRACT | Status: DC

## 2014-01-22 MED ORDER — ALBUTEROL (5 MG/ML) CONTINUOUS INHALATION SOLN
10.0000 mg/h | INHALATION_SOLUTION | RESPIRATORY_TRACT | Status: AC
  Administered 2014-01-22: 10 mg/h via RESPIRATORY_TRACT

## 2014-01-22 MED ORDER — BENZONATATE 100 MG PO CAPS
200.0000 mg | ORAL_CAPSULE | Freq: Once | ORAL | Status: AC
  Administered 2014-01-22: 200 mg via ORAL
  Filled 2014-01-22: qty 2

## 2014-01-22 MED ORDER — HYDROMORPHONE HCL PF 1 MG/ML IJ SOLN
1.0000 mg | Freq: Once | INTRAMUSCULAR | Status: AC
  Administered 2014-01-22: 1 mg via INTRAVENOUS
  Filled 2014-01-22: qty 1

## 2014-01-22 MED ORDER — ALBUTEROL (5 MG/ML) CONTINUOUS INHALATION SOLN: 10.0000 mg/h | INHALATION_SOLUTION | Freq: Once | RESPIRATORY_TRACT | Status: DC

## 2014-01-22 MED ORDER — IPRATROPIUM BROMIDE 0.02 % IN SOLN
0.5000 mg | Freq: Once | RESPIRATORY_TRACT | Status: AC
  Administered 2014-01-22: 0.5 mg via RESPIRATORY_TRACT

## 2014-01-22 MED ORDER — HYDROCODONE-HOMATROPINE 5-1.5 MG/5ML PO SYRP
5.0000 mL | ORAL_SOLUTION | Freq: Once | ORAL | Status: DC
  Filled 2014-01-22: qty 5

## 2014-01-22 MED ORDER — HYDROCODONE-ACETAMINOPHEN 7.5-325 MG/15ML PO SOLN
10.0000 mL | Freq: Once | ORAL | Status: AC
Start: 2014-01-22 — End: 2014-01-22
  Administered 2014-01-22: 10 mL via ORAL
  Filled 2014-01-22: qty 15

## 2014-01-22 NOTE — ED Notes (Signed)
Patient transported to X-ray 

## 2014-01-22 NOTE — ED Notes (Signed)
Fluids provided per patient request, pt also given my personal iphone charger so that he could charge his phone in order to get in contact with family members

## 2014-01-22 NOTE — ED Provider Notes (Signed)
Medical screening examination/treatment/procedure(s) were conducted as a shared visit with non-physician practitioner(s) and myself.  I personally evaluated the patient during the encounter.  EKG Interpretation    Date/Time:  Tuesday January 22 2014 22:47:59 EST Ventricular Rate:  109 PR Interval:  124 QRS Duration: 92 QT Interval:  332 QTC Calculation: 447 R Axis:   67 Text Interpretation:  Sinus tachycardia ST \\T \ T wave abnormality, consider anterior ischemia No previous tracing Confirmed by Anitra LauthPLUNKETT  MD, Ford Peddie ([id number]) on 01/22/2014 11:00:36 PM            Pt with persistent severe asthma exacerbation which is not responding to magnesium, steroids or albuterol.  Persistent wheezing and tachypnea and O2 sats staying in the mid to low 90's.  No sx concerning for PE and pt was perc neg on arrival and only c/o is that lungs are on fire.  No prior cardiac hx and EKG with nonspecific t-wave inversions.  Pt will be admitted for further care to [facility].  CRITICAL CARE Performed by: Gwyneth SproutPLUNKETT,Anali Cabanilla Total critical care time: 30 Critical care time was exclusive of separately billable procedures and treating other patients. Critical care was necessary to treat or prevent imminent or life-threatening deterioration. Critical care was time spent personally by me on the following activities: development of treatment plan with patient and/or surrogate as well as nursing, discussions with consultants, evaluation of patient's response to treatment, examination of patient, obtaining history from patient or surrogate, ordering and performing treatments and interventions, ordering and review of laboratory studies, ordering and review of radiographic studies, pulse oximetry and re-evaluation of patient's condition.   Gwyneth SproutWhitney Izzabella Besse, MD 01/22/14 [phone]

## 2014-01-22 NOTE — ED Notes (Signed)
Pt c/o URI symptoms with SOB x 3 weeks

## 2014-01-22 NOTE — ED Notes (Signed)
Pt requesting more pain medications.  MD and PA made aware. No new orders at this time.

## 2014-01-22 NOTE — ED Provider Notes (Signed)
CSN: [id number]     Arrival date & time 01/22/14  1811 History   First MD Initiated Contact with Patient 01/22/14 1823     Chief Complaint  Patient presents with  . URI     (Consider location/radiation/quality/duration/timing/severity/associated sxs/prior Treatment) HPI Comments: Patient is 33 year old male with history of asthma and smoking presents again to the ED after having been seen on the 8th and seen by PCP on the 9th with continuing symptoms.  He states that he continues with cough, non-productive, wheezing, chest tightness and pain with cough (states "feels like my lungs are on fire"), nasal congestion.  He has finished zithromycin for bronchitis, has been using an inhaler, and tussionex without relief of symptoms.  He reports low grade fever, but denies chills, states that he has not smoked in the past 4 days as well.  Reports no recent travel, no personal history of cancer, his PCP, Dr. Laverle PatterBorden [facility], placed him on steroids yesterday.  Patient is a 33 y.o. male presenting with URI. The history is provided by the patient. No language interpreter was used.  URI Presenting symptoms: congestion, cough and rhinorrhea   Severity:  Severe Onset quality:  Gradual Duration:  3 weeks Timing:  Constant Progression:  Worsening Chronicity:  New Relieved by:  Nothing Worsened by:  Breathing Ineffective treatments:  Decongestant, inhaler, nebulizer treatments and OTC medications Associated symptoms: myalgias   Associated symptoms: no headaches   Risk factors: no recent illness, no recent travel and no sick contacts     Past Medical History  Diagnosis Date  . Status post dilation of esophageal narrowing   . Chronic back pain   . Renal disorder   . Asthma    Past Surgical History  Procedure Laterality Date  . Upper endoscopy w/ esophageal manometry    . Rhinoplasty     History reviewed. No pertinent family history. History  Substance Use Topics  .  Smoking status: Current Every Day Smoker -- 0.50 packs/day    Types: Cigarettes  . Smokeless tobacco: Never Used  . Alcohol Use: No    Review of Systems  HENT: Positive for congestion and rhinorrhea.   Respiratory: Positive for cough.   Musculoskeletal: Positive for myalgias.  Neurological: Negative for headaches.  All other systems reviewed and are negative.      Allergies  Review of patient's allergies indicates no known allergies.  Home Medications   Current Outpatient Rx  Name  Route  Sig  Dispense  Refill  . Fluticasone-Salmeterol (ADVAIR) 250-50 MCG/DOSE AEPB   Inhalation   Inhale 1 puff into the lungs every 12 (twelve) hours.           Marland Kitchen. guaiFENesin-codeine (ROBITUSSIN AC) 100-10 MG/5ML syrup   Oral   Take 5 mLs by mouth 3 (three) times daily as needed for cough.   120 mL   0   . omeprazole (PRILOSEC) 20 MG capsule   Oral   Take 2 capsules (40 mg total) by mouth daily as needed. For acid reflux   30 capsule   0   . predniSONE (DELTASONE) 10 MG tablet   Oral   Take 2 tablets (20 mg total) by mouth daily.   12 tablet   0    BP 165/81  Pulse 77  Temp(Src) 99 F (37.2 C) (Oral)  Resp 20  Wt 200 lb (90.719 kg)  SpO2 94% Physical Exam  Nursing note and vitals reviewed. Constitutional: He is oriented  to person, place, and time. He appears well-developed and well-nourished.  Uncomfortable appearing.  HENT:  Head: Normocephalic and atraumatic.  Right Ear: External ear normal.  Left Ear: External ear normal.  Mouth/Throat: No oropharyngeal exudate.  Posterior pharyngeal erythema, boggy nasal mucosa with clear rhinorrhea  Eyes: Conjunctivae are normal. Pupils are equal, round, and reactive to light. No scleral icterus.  Neck: Normal range of motion. Neck supple.  Cardiovascular: Normal rate, regular rhythm and normal heart sounds.  Exam reveals no gallop and no friction rub.   No murmur heard. Pulmonary/Chest: Accessory muscle usage present. Tachypnea  noted. He is in respiratory distress. He has decreased breath sounds in the right middle field, the right lower field, the left middle field and the left lower field. He has wheezes in the right upper field and the left upper field. He has rhonchi in the right middle field and the right lower field. He has no rales.  Abdominal: Soft. Bowel sounds are normal. He exhibits no distension. There is no tenderness. There is no rebound and no guarding.  Musculoskeletal: Normal range of motion. He exhibits no edema and no tenderness.  Lymphadenopathy:    He has no cervical adenopathy.  Neurological: He is alert and oriented to person, place, and time. He exhibits normal muscle tone. Coordination normal.  Skin: Skin is warm and dry. No rash noted. No erythema. No pallor.  Psychiatric: He has a normal mood and affect. His behavior is normal. Judgment and thought content normal.    ED Course  Procedures (including critical care time) Labs Review Labs Reviewed  BASIC METABOLIC PANEL - Abnormal; Notable for the following:    Potassium 3.1 (*)    Glucose, Bld 146 (*)    Calcium 8.3 (*)    GFR calc non Af Amer 79 (*)    All other components within normal limits  POCT I-STAT 3, BLOOD GAS (G3+) - Abnormal; Notable for the following:    pCO2 arterial 33.9 (*)    pO2, Arterial 45.0 (*)    Acid-base deficit 3.0 (*)    All other components within normal limits  CBC WITH DIFFERENTIAL   Imaging Review Dg Chest 2 View  01/22/2014   CLINICAL DATA:  Cough and congestion.  EXAM: CHEST  2 VIEW  COMPARISON:  PA and lateral chest 01/20/2014 and PA and lateral chest 12/11/2005.  FINDINGS: Lungs are clear. Heart size is normal. No pneumothorax or pleural effusion. No focal bony abnormality.  IMPRESSION: Negative chest.   Electronically Signed   By: Drusilla Kanner M.D.   On: 01/22/2014 20:59    EKG Interpretation   None      Results for orders placed during the hospital encounter of 01/22/14  CBC WITH  DIFFERENTIAL      Result Value Range   WBC 9.9  4.0 - 10.5 K/uL   RBC 4.61  4.22 - 5.81 MIL/uL   Hemoglobin 14.7  13.0 - 17.0 g/dL   HCT 54.0  98.1 - 19.1 %   MCV 91.5  78.0 - 100.0 fL   MCH 31.9  26.0 - 34.0 pg   MCHC 34.8  30.0 - 36.0 g/dL   RDW 47.8  29.5 - 62.1 %   Platelets 179  150 - 400 K/uL   Neutrophils Relative % 57  43 - 77 %   Neutro Abs 5.6  1.7 - 7.7 K/uL   Lymphocytes Relative 33  12 - 46 %   Lymphs Abs 3.2  0.7 - 4.0  K/uL   Monocytes Relative 10  3 - 12 %   Monocytes Absolute 1.0  0.1 - 1.0 K/uL   Eosinophils Relative 0  0 - 5 %   Eosinophils Absolute 0.0  0.0 - 0.7 K/uL   Basophils Relative 0  0 - 1 %   Basophils Absolute 0.0  0.0 - 0.1 K/uL  BASIC METABOLIC PANEL      Result Value Range   Sodium 144  137 - 147 mEq/L   Potassium 3.1 (*) 3.7 - 5.3 mEq/L   Chloride 108  96 - 112 mEq/L   CO2 21  19 - 32 mEq/L   Glucose, Bld 146 (*) 70 - 99 mg/dL   BUN 15  6 - 23 mg/dL   Creatinine, Ser 1.61  0.50 - 1.35 mg/dL   Calcium 8.3 (*) 8.4 - 10.5 mg/dL   GFR calc non Af Amer 79 (*) >90 mL/min   GFR calc Af Amer >90  >90 mL/min  POCT I-STAT 3, BLOOD GAS (G3+)      Result Value Range   pH, Arterial 7.402  7.350 - 7.450   pCO2 arterial 33.9 (*) 35.0 - 45.0 mmHg   pO2, Arterial 45.0 (*) 80.0 - 100.0 mmHg   Bicarbonate 21.1  20.0 - 24.0 mEq/L   TCO2 22  0 - 100 mmol/L   O2 Saturation 82.0     Acid-base deficit 3.0 (*) 0.0 - 2.0 mmol/L   Patient temperature 98.4 F     Collection site RADIAL, ALLEN'S TEST ACCEPTABLE     Drawn by RT     Sample type ARTERIAL     Dg Chest 2 View  01/22/2014   CLINICAL DATA:  Cough and congestion.  EXAM: CHEST  2 VIEW  COMPARISON:  PA and lateral chest 01/20/2014 and PA and lateral chest 12/11/2005.  FINDINGS: Lungs are clear. Heart size is normal. No pneumothorax or pleural effusion. No focal bony abnormality.  IMPRESSION: Negative chest.   Electronically Signed   By: Drusilla Kanner M.D.   On: 01/22/2014 20:59   Dg Chest 2  View  01/20/2014   CLINICAL DATA:  Cough.  Congestion.  General body aches for 2 weeks.  EXAM: CHEST  2 VIEW  COMPARISON:  12/11/2005  FINDINGS: Midline trachea. Normal heart size and mediastinal contours. No pleural effusion or pneumothorax. Clear lungs.  IMPRESSION: Normal chest.   Electronically Signed   By: Jeronimo Greaves M.D.   On: 01/20/2014 17:44    Medications  albuterol (PROVENTIL,VENTOLIN) solution continuous neb (0 mg/hr Nebulization Stopped 01/22/14 1954)  albuterol (PROVENTIL,VENTOLIN) solution continuous neb (15 mg/hr Nebulization New Bag/Given 01/22/14 2009)  ipratropium (ATROVENT) nebulizer solution 0.5 mg (0.5 mg Nebulization Given 01/22/14 1840)  benzonatate (TESSALON) capsule 200 mg (200 mg Oral Given 01/22/14 1934)  HYDROcodone-acetaminophen (HYCET) 7.5-325 mg/15 ml solution 10 mL (10 mLs Oral Given 01/22/14 1958)  magnesium sulfate (IV Push/IM) injection 2 g (2 g Intravenous Given 01/22/14 2055)  HYDROmorphone (DILAUDID) injection 1 mg (1 mg Intravenous Given 01/22/14 2144)     MDM   Asthma exacerbation   Patient here with worsening of respiratory distress and cough, he has been followed by his PCP and been seen here as well without any relief in symptoms, despite an hour long nebulizer and repeat of short albuterol treatment, he continues with diffuse expiratory wheezing and tachypnea.  I do not suspect PE at this time as he was [facility] negative upon arrival.  I have spoken with Dr. Selena Batten with  [facility] who will get the patient admitted there.  Izola Price Marisue Humble, PA-C 01/22/14 2242

## 2014-01-25 NOTE — ED Provider Notes (Signed)
Medical screening examination/treatment/procedure(s) were performed by non-physician practitioner and as supervising physician I was immediately available for consultation/collaboration.    Nelia Shiobert L Pretty Weltman, MD 01/25/14 [phone]

## 2014-03-03 ENCOUNTER — Emergency Department (HOSPITAL_BASED_OUTPATIENT_CLINIC_OR_DEPARTMENT_OTHER)
Admission: EM | Admit: 2014-03-03 | Discharge: 2014-03-04 | Disposition: A | Discharge status: Home / Self Care | Attending: Emergency Medicine | Admitting: Emergency Medicine

## 2014-03-03 ENCOUNTER — Encounter (HOSPITAL_BASED_OUTPATIENT_CLINIC_OR_DEPARTMENT_OTHER): Payer: Self-pay | Admitting: Emergency Medicine

## 2014-03-03 DIAGNOSIS — M5430 Sciatica, unspecified side: Secondary | ICD-10-CM | POA: Insufficient documentation

## 2014-03-03 DIAGNOSIS — IMO0002 Reserved for concepts with insufficient information to code with codable children: Secondary | ICD-10-CM | POA: Insufficient documentation

## 2014-03-03 DIAGNOSIS — X500XXA Overexertion from strenuous movement or load, initial encounter: Secondary | ICD-10-CM | POA: Insufficient documentation

## 2014-03-03 DIAGNOSIS — G8929 Other chronic pain: Secondary | ICD-10-CM | POA: Insufficient documentation

## 2014-03-03 DIAGNOSIS — J45909 Unspecified asthma, uncomplicated: Secondary | ICD-10-CM | POA: Insufficient documentation

## 2014-03-03 DIAGNOSIS — Y93B2 Activity, push-ups, pull-ups, sit-ups: Secondary | ICD-10-CM | POA: Insufficient documentation

## 2014-03-03 DIAGNOSIS — Z8719 Personal history of other diseases of the digestive system: Secondary | ICD-10-CM | POA: Insufficient documentation

## 2014-03-03 DIAGNOSIS — Z87448 Personal history of other diseases of urinary system: Secondary | ICD-10-CM | POA: Insufficient documentation

## 2014-03-03 DIAGNOSIS — F17200 Nicotine dependence, unspecified, uncomplicated: Secondary | ICD-10-CM | POA: Insufficient documentation

## 2014-03-03 DIAGNOSIS — Y929 Unspecified place or not applicable: Secondary | ICD-10-CM | POA: Insufficient documentation

## 2014-03-03 NOTE — ED Notes (Signed)
Pt reports he was doing pull-ups tonight and when landed on feet began having back pain- Hx of same

## 2014-03-04 MED ORDER — OXYCODONE-ACETAMINOPHEN 5-325 MG PO TABS
2.0000 | ORAL_TABLET | Freq: Once | ORAL | Status: AC
Start: 2014-03-04 — End: 2014-03-04
  Administered 2014-03-04: 2 via ORAL
  Filled 2014-03-04: qty 2

## 2014-03-04 MED ORDER — OXYCODONE-ACETAMINOPHEN 5-325 MG PO TABS: 1.0000 | ORAL_TABLET | Freq: Four times a day (QID) | ORAL | Status: DC | PRN

## 2014-03-04 NOTE — ED Provider Notes (Signed)
CSN: [id number]     Arrival date & time 03/03/14  2051 History  This chart was scribed for Hanley SeamenJohn L Greer Wainright, MD by Blanchard KelchNicole Curnes, ED Scribe. The patient was seen in room MH08/MH08. Patient's care was started at 12:46 AM.    Chief Complaint  Patient presents with  . Back Pain     The history is provided by the patient. No language interpreter was used.    HPI Comments: Scott Brock is a 33 y.o. male who presents to the Emergency Department complaining of constant, moderate to severe left lower back pain that began five hours ago. He states he was doing pull ups and felt a sudden onset pain after jumping down and landing on his feet. The pain radiates down his left lower extremity. He denies lower extremity numbness. He reports a history of similar pain diagnosed as sciatica that began in 2009. He has been seen by a specialist and denies recurrence of the pain since 2013. He was admitted to the hospital last month due to a severe asthma attack. He was discharged with Oxycodone for his persistent cough.  PCP is at [facility].  Past Medical History  Diagnosis Date  . Status post dilation of esophageal narrowing   . Chronic back pain   . Renal disorder   . Asthma    Past Surgical History  Procedure Laterality Date  . Upper endoscopy w/ esophageal manometry    . Rhinoplasty     No family history on file. History  Substance Use Topics  . Smoking status: Current Every Day Smoker -- 0.50 packs/day    Types: Cigarettes  . Smokeless tobacco: Never Used  . Alcohol Use: No    Review of Systems A complete 10 system review of systems was obtained and all systems are negative except as noted in the HPI and PMH.     Allergies  Review of patient's allergies indicates no known allergies.  Home Medications   Current Outpatient Rx  Name  Route  Sig  Dispense  Refill  . Fluticasone-Salmeterol (ADVAIR) 250-50 MCG/DOSE AEPB   Inhalation   Inhale 1 puff into the lungs every 12  (twelve) hours.           Marland Kitchen. guaiFENesin-codeine (ROBITUSSIN AC) 100-10 MG/5ML syrup   Oral   Take 5 mLs by mouth 3 (three) times daily as needed for cough.   120 mL   0   . omeprazole (PRILOSEC) 20 MG capsule   Oral   Take 2 capsules (40 mg total) by mouth daily as needed. For acid reflux   30 capsule   0   . oxyCODONE-acetaminophen (PERCOCET/ROXICET) 5-325 MG per tablet   Oral   Take 1-2 tablets by mouth every 6 (six) hours as needed (for pain).   20 tablet   0   . predniSONE (DELTASONE) 10 MG tablet   Oral   Take 2 tablets (20 mg total) by mouth daily.   12 tablet   0    Triage Vitals: BP 129/67  Pulse 80  Temp(Src) 98.7 F (37.1 C) (Oral)  Resp 20  Ht 6\' 1"  (1.854 m)  SpO2 100%  Physical Exam  Nursing note and vitals reviewed. General: Well-developed, well-nourished male in no acute distress; appearance consistent with age of record HENT: normocephalic; atraumatic Eyes: pupils equal, round and reactive to light; extraocular muscles intact Neck: supple Heart: regular rate and rhythm; no murmurs, rubs or gallops Lungs: clear to auscultation bilaterally Abdomen: soft; nondistended; nontender;  no masses or hepatosplenomegaly; bowel sounds present Extremities: No deformity; full range of motion except left hip due to pain; pulses normal Chest: left lower rib tenderness without deformity or crepitus Back: tenderness to left paralumbar region; pain with movement of back; positive straight leg raise on the left at approximately 10 degrees Neurologic: Awake, alert and oriented; motor function intact in all extremities and appears symmetric; no facial droop; sensation intact and equal in lower extremities Skin: Warm and dry Psychiatric: Normal mood and affect   ED Course  Procedures (including critical care time)  DIAGNOSTIC STUDIES: Oxygen Saturation is 100% on room air, normal by my interpretation.    COORDINATION OF CARE: 12:54 AM -Will discharge patient with  pain medication. Recommend follow up with PCP for imaging if symptoms persist. Patient verbalizes understanding and agrees with treatment plan.   MDM   Final diagnoses:  Sciatica    I personally performed the services described in this documentation, which was scribed in my presence. The recorded information has been reviewed and is accurate.    Hanley Seamen, MD 03/04/14 [phone]

## 2014-06-16 ENCOUNTER — Encounter (HOSPITAL_BASED_OUTPATIENT_CLINIC_OR_DEPARTMENT_OTHER): Payer: Self-pay | Admitting: Emergency Medicine

## 2014-06-16 ENCOUNTER — Emergency Department (HOSPITAL_BASED_OUTPATIENT_CLINIC_OR_DEPARTMENT_OTHER)
Admission: EM | Admit: 2014-06-16 | Discharge: 2014-06-16 | Disposition: A | Discharge status: Home / Self Care | Attending: Emergency Medicine | Admitting: Emergency Medicine

## 2014-06-16 DIAGNOSIS — K122 Cellulitis and abscess of mouth: Secondary | ICD-10-CM | POA: Insufficient documentation

## 2014-06-16 DIAGNOSIS — G8929 Other chronic pain: Secondary | ICD-10-CM | POA: Insufficient documentation

## 2014-06-16 DIAGNOSIS — J45909 Unspecified asthma, uncomplicated: Secondary | ICD-10-CM | POA: Insufficient documentation

## 2014-06-16 DIAGNOSIS — F17200 Nicotine dependence, unspecified, uncomplicated: Secondary | ICD-10-CM | POA: Insufficient documentation

## 2014-06-16 DIAGNOSIS — Z79899 Other long term (current) drug therapy: Secondary | ICD-10-CM | POA: Insufficient documentation

## 2014-06-16 DIAGNOSIS — Z87448 Personal history of other diseases of urinary system: Secondary | ICD-10-CM | POA: Insufficient documentation

## 2014-06-16 DIAGNOSIS — J069 Acute upper respiratory infection, unspecified: Secondary | ICD-10-CM | POA: Insufficient documentation

## 2014-06-16 DIAGNOSIS — IMO0002 Reserved for concepts with insufficient information to code with codable children: Secondary | ICD-10-CM | POA: Insufficient documentation

## 2014-06-16 DIAGNOSIS — Z9889 Other specified postprocedural states: Secondary | ICD-10-CM | POA: Insufficient documentation

## 2014-06-16 LAB — RAPID STREP SCREEN (MED CTR MEBANE ONLY): Streptococcus, Group A Screen (Direct): NEGATIVE

## 2014-06-16 NOTE — ED Notes (Signed)
Pt reports difficulty swallowing, white patches on throat

## 2014-06-16 NOTE — Discharge Instructions (Signed)
Uvulitis Uvulitis is redness and soreness (inflammation) of the uvula. The uvula is the small tongue-shaped piece of tissue in the back of your mouth.  CAUSES Infection is a common cause of uvulitis. Infection of the uvula can be either viral or bacterial. Infectious uvulitis usually only occurs in association with another condition, such as inflammation and infection of the mouth or throat. Other causes of uvulitis include:  Trauma to the uvula.  Swelling from excess fluid buildup (edema), which may be an allergic reaction.  Inhalation of irritants, such as chemical agents, smoke, or steam. DIAGNOSIS Your caregiver can usually diagnose uvulitis through a physical examination. Bacterial uvulitis can be diagnosed through the results of the growth of samples of bodily substances taken from your mouth (cultures). HOME CARE INSTRUCTIONS   Rest as much as possible.  Young children may suck on frozen juice bars or frozen ice pops. Older children and adults may gargle with a warm or cold liquid to help soothe the throat. (Mix  tsp of salt in 8 oz of water, or use strong tea.)  Use a cool-mist humidifier to lessen throat irritation and cough.  Drink enough fluids to keep your urine clear or pale yellow.  While the throat is very sore, eat soft or liquid foods such as milk, ice cream, soups, or milk drinks.  Family members who develop a sore throat or fever should have a medical exam or throat culture.  If your child has uvulitis and is taking antibiotic medicine, wait 24 hours or until his or her temperature is near normal (less than 100 F [37.8 C]) before allowing him or her to return to school or day care.  Only take over-the-counter or prescription medicines for pain, discomfort, or fever as directed by your caregiver. Ask when your test results will be ready. Make sure you get your test results. SEEK MEDICAL CARE IF:   You have an oral temperature above 102 F (38.9 C).  You  develop large, tender lumps your the neck.  Your child develops a rash.  You cough up green, yellow-brown, or bloody substances. SEEK IMMEDIATE MEDICAL CARE IF:   You develop any new symptoms, such as vomiting, earache, severe headache, stiff neck, chest pain, or trouble breathing or swallowing.  Your airway is blocked.  You develop more severe throat pain along with drooling or voice changes. Document Released: 07/09/2004 Document Revised: 02/21/2012 Document Reviewed: 02/04/2011 [facility] Patient Information 2015 [place], [place]. This information is not intended to replace advice given to you by your health care provider. Make sure you discuss any questions you have with your health care provider. Upper Respiratory Infection, Adult An upper respiratory infection (URI) is also known as the common cold. It is often caused by a type of germ (virus). Colds are easily spread (contagious). You can pass it to others by kissing, coughing, sneezing, or drinking out of the same glass. Usually, you get better in 1 or 2 weeks.  HOME CARE   Only take medicine as told by your doctor.  Use a warm mist humidifier or breathe in steam from a hot shower.  Drink enough water and fluids to keep your pee (urine) clear or pale yellow.  Get plenty of rest.  Return to work when your temperature is back to normal or as told by your doctor. You may use a face mask and wash your hands to stop your cold from spreading. GET HELP RIGHT AWAY IF:   After the first few days, you feel  you are getting worse.  You have questions about your medicine.  You have chills, shortness of breath, or brown or red spit (mucus).  You have yellow or brown snot (nasal discharge) or pain in the face, especially when you bend forward.  You have a fever, puffy (swollen) neck, pain when you swallow, or white spots in the back of your throat.  You have a bad headache, ear pain, sinus pain, or chest pain.  You have a high-pitched  whistling sound when you breathe in and out (wheezing).  You have a lasting cough or cough up blood.  You have sore muscles or a stiff neck. MAKE SURE YOU:   Understand these instructions.  Will watch your condition.  Will get help right away if you are not doing well or get worse. Document Released: 05/17/2008 Document Revised: 02/21/2012 Document Reviewed: 04/05/2011 [facility] Patient Information 2015 [place]ExitCare, MarylandLLC. This information is not intended to replace advice given to you by your health care provider. Make sure you discuss any questions you have with your health care provider.

## 2014-06-16 NOTE — ED Provider Notes (Signed)
CSN: [id number]     Arrival date & time 06/16/14  [id number] History   None    Chief Complaint  Patient presents with  . Sore Throat     (Consider location/radiation/quality/duration/timing/severity/associated sxs/prior Treatment) HPI Nasal congestion ,eye discharge, sore throat for one day.  No fever, pain with swallowing but taking liquids easily.    Past Medical History  Diagnosis Date  . Status post dilation of esophageal narrowing   . Chronic back pain   . Renal disorder   . Asthma    Past Surgical History  Procedure Laterality Date  . Upper endoscopy w/ esophageal manometry    . Rhinoplasty     History reviewed. No pertinent family history. History  Substance Use Topics  . Smoking status: Current Every Day Smoker -- 0.50 packs/day    Types: Cigarettes  . Smokeless tobacco: Never Used  . Alcohol Use: No    Review of Systems  All other systems reviewed and are negative.     Allergies  Review of patient's allergies indicates no known allergies.  Home Medications   Prior to Admission medications   Medication Sig Start Date End Date Taking? Authorizing Provider  gabapentin (NEURONTIN) 600 MG tablet Take 600 mg by mouth 3 (three) times daily.   Yes Historical Provider, MD  oxyCODONE (ROXICODONE) 15 MG immediate release tablet Take 15 mg by mouth every 4 (four) hours as needed for pain.   Yes Historical Provider, MD  Fluticasone-Salmeterol (ADVAIR) 250-50 MCG/DOSE AEPB Inhale 1 puff into the lungs every 12 (twelve) hours.      Historical Provider, MD  guaiFENesin-codeine (ROBITUSSIN AC) 100-10 MG/5ML syrup Take 5 mLs by mouth 3 (three) times daily as needed for cough. 01/20/14   Hope Orlene OchM Neese, NP  omeprazole (PRILOSEC) 20 MG capsule Take 2 capsules (40 mg total) by mouth daily as needed. For acid reflux 05/26/13   Charles B. Bernette MayersSheldon, MD  oxyCODONE-acetaminophen (PERCOCET/ROXICET) 5-325 MG per tablet Take 1-2 tablets by mouth every 6 (six) hours as needed (for pain). 03/04/14    John L Molpus, MD  predniSONE (DELTASONE) 10 MG tablet Take 2 tablets (20 mg total) by mouth daily. 01/20/14   Hope Orlene OchM Neese, NP   BP 134/65  Pulse 82  Temp(Src) 99.7 F (37.6 C) (Oral)  Resp 20  Ht 6\' 1"  (1.854 m)  Wt 152 lb (68.947 kg)  BMI 20.06 kg/m2  SpO2 97% Physical Exam  Nursing note and vitals reviewed. Constitutional: He is oriented to person, place, and time. He appears well-developed and well-nourished.  HENT:  Head: Normocephalic and atraumatic.  Right Ear: External ear normal.  Left Ear: External ear normal.  Uvula inflamed and enlarged  Eyes: Conjunctivae and EOM are normal. Pupils are equal, round, and reactive to light.  Neck: Normal range of motion. Neck supple.  Cardiovascular: Normal rate, regular rhythm, normal heart sounds and intact distal pulses.   Pulmonary/Chest: Effort normal and breath sounds normal.  Abdominal: Soft. Bowel sounds are normal.  Musculoskeletal: Normal range of motion.  Neurological: He is alert and oriented to person, place, and time. He has normal reflexes.  Skin: Skin is warm.  Psychiatric: He has a normal mood and affect. His behavior is normal. Thought content normal.    ED Course  Procedures (including critical care time) Labs Review Labs Reviewed  RAPID STREP SCREEN  CULTURE, GROUP A STREP    Imaging Review No results found.   EKG Interpretation None      MDM  Final diagnoses:  URI (upper respiratory infection)  Uvulitis     Hilario Quarryanielle S Alyxis Grippi, MD 06/16/14 [id number]

## 2014-06-18 LAB — CULTURE, GROUP A STREP

## 2014-08-07 ENCOUNTER — Ambulatory Visit (INDEPENDENT_AMBULATORY_CARE_PROVIDER_SITE_OTHER): Admitting: Emergency Medicine

## 2014-08-07 VITALS — BP 130/80 | HR 101 | Temp 98.5°F | Resp 20 | Ht 73.0 in | Wt 151.8 lb

## 2014-08-07 DIAGNOSIS — M5432 Sciatica, left side: Secondary | ICD-10-CM

## 2014-08-07 DIAGNOSIS — M5430 Sciatica, unspecified side: Secondary | ICD-10-CM

## 2014-08-07 MED ORDER — CYCLOBENZAPRINE HCL 10 MG PO TABS: 10.0000 mg | ORAL_TABLET | Freq: Three times a day (TID) | ORAL | Status: DC | PRN

## 2014-08-07 MED ORDER — OXYCODONE-ACETAMINOPHEN 5-325 MG PO TABS: 1.0000 | ORAL_TABLET | ORAL | Status: DC | PRN

## 2014-08-07 MED ORDER — NAPROXEN SODIUM 550 MG PO TABS: 550.0000 mg | ORAL_TABLET | Freq: Two times a day (BID) | ORAL | Status: DC

## 2014-08-07 NOTE — Patient Instructions (Signed)
Sciatica Sciatica is pain, weakness, numbness, or tingling along the path of the sciatic nerve. The nerve starts in the lower back and runs down the back of each leg. The nerve controls the muscles in the lower leg and in the back of the knee, while also providing sensation to the back of the thigh, lower leg, and the sole of your foot. Sciatica is a symptom of another medical condition. For instance, nerve damage or certain conditions, such as a herniated disk or bone spur on the spine, pinch or put pressure on the sciatic nerve. This causes the pain, weakness, or other sensations normally associated with sciatica. Generally, sciatica only affects one side of the body. CAUSES   Herniated or slipped disc.  Degenerative disk disease.  A pain disorder involving the narrow muscle in the buttocks (piriformis syndrome).  Pelvic injury or fracture.  Pregnancy.  Tumor (rare). SYMPTOMS  Symptoms can vary from mild to very severe. The symptoms usually travel from the low back to the buttocks and down the back of the leg. Symptoms can include:  Mild tingling or dull aches in the lower back, leg, or hip.  Numbness in the back of the calf or sole of the foot.  Burning sensations in the lower back, leg, or hip.  Sharp pains in the lower back, leg, or hip.  Leg weakness.  Severe back pain inhibiting movement. These symptoms may get worse with coughing, sneezing, laughing, or prolonged sitting or standing. Also, being overweight may worsen symptoms. DIAGNOSIS  Your caregiver will perform a physical exam to look for common symptoms of sciatica. He or she may ask you to do certain movements or activities that would trigger sciatic nerve pain. Other tests may be performed to find the cause of the sciatica. These may include:  Blood tests.  X-rays.  Imaging tests, such as an MRI or CT scan. TREATMENT  Treatment is directed at the cause of the sciatic pain. Sometimes, treatment is not necessary  and the pain and discomfort goes away on its own. If treatment is needed, your caregiver may suggest:  Over-the-counter medicines to relieve pain.  Prescription medicines, such as anti-inflammatory medicine, muscle relaxants, or narcotics.  Applying heat or ice to the painful area.  Steroid injections to lessen pain, irritation, and inflammation around the nerve.  Reducing activity during periods of pain.  Exercising and stretching to strengthen your abdomen and improve flexibility of your spine. Your caregiver may suggest losing weight if the extra weight makes the back pain worse.  Physical therapy.  Surgery to eliminate what is pressing or pinching the nerve, such as a bone spur or part of a herniated disk. HOME CARE INSTRUCTIONS   Only take over-the-counter or prescription medicines for pain or discomfort as directed by your caregiver.  Apply ice to the affected area for 20 minutes, 3-4 times a day for the first 48-72 hours. Then try heat in the same way.  Exercise, stretch, or perform your usual activities if these do not aggravate your pain.  Attend physical therapy sessions as directed by your caregiver.  Keep all follow-up appointments as directed by your caregiver.  Do not wear high heels or shoes that do not provide proper support.  Check your mattress to see if it is too soft. A firm mattress may lessen your pain and discomfort. SEEK IMMEDIATE MEDICAL CARE IF:   You lose control of your bowel or bladder (incontinence).  You have increasing weakness in the lower back, pelvis, buttocks,   or legs.  You have redness or swelling of your back.  You have a burning sensation when you urinate.  You have pain that gets worse when you lie down or awakens you at night.  Your pain is worse than you have experienced in the past.  Your pain is lasting longer than 4 weeks.  You are suddenly losing weight without reason. MAKE SURE YOU:  Understand these  instructions.  Will watch your condition.  Will get help right away if you are not doing well or get worse. Document Released: 11/23/2001 Document Revised: 05/30/2012 Document Reviewed: 04/09/2012 Exit[employer] Patient Information 2015 Exit[employer], LLC. This information is not intended to replace advice given to you by your health care provider. Make sure you discuss any questions you have with your health care provider.  

## 2014-08-07 NOTE — Progress Notes (Signed)
[facility] [address] [place] [address] [phone]  Date:  08/07/2014   Name:  Scott Brock [facility]   DOB:  May 16, 1981   MRN:  [id number]  PCP:  Pcp Not In System    Chief Complaint: Back Pain   History of Present Illness:  Scott Brock is a 33 y.o. 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  Is treated with gabapentin and ibuprofen.  Has a baseline level of pain and it increases with prolonged (2 hours) standing.  Today he jumped out of a truck and has radicular pain n left leg No weakness or numbness Says he takes no narcotics and is under the care of the [place]. No improvement with over the counter medications or other home remedies. Denies other complaint or health concern today.   There are no active problems to display for this patient.   Past Medical History  Diagnosis Date  . Status post dilation of esophageal narrowing   . Chronic back pain   . Renal disorder   . Asthma     Past Surgical History  Procedure Laterality Date  . Upper endoscopy w/ esophageal manometry    . Rhinoplasty      History  Substance Use Topics  . Smoking status: Current Every Day Smoker -- 0.50 packs/day    Types: Cigarettes  . Smokeless tobacco: Never Used  . Alcohol Use: No    No family history on file.  No Known Allergies  Medication list has been reviewed and updated.  Current Outpatient Prescriptions on File Prior to Visit  Medication Sig Dispense Refill  . Fluticasone-Salmeterol (ADVAIR) 250-50 MCG/DOSE AEPB Inhale 1 puff into the lungs every 12 (twelve) hours as needed.        No current facility-administered medications on file prior to visit.    Review of Systems:  As per HPI, otherwise negative.    Physical Examination: Filed Vitals:   08/07/14 1909  BP: 130/80  Pulse: 101  Temp: 98.5 F (36.9 C)  Resp: 20   Filed Vitals:   08/07/14 1909  Height:   (1.854 m)  Weight: 151 lb 12.8 oz (68.856 kg)   Body mass index is 20.03 kg/(m^2). Ideal Body Weight: Weight in (lb) to have BMI = 25: 189.1  GEN: WDWN, marked distress, Non-toxic, A & O x 3 HEENT: Atraumatic, Normocephalic. Neck supple. No masses, No LAD. Ears and Nose: No external deformity. CV: RRR, No M/G/R. No JVD. No thrill. No extra heart sounds. PULM: CTA B, no wheezes, crackles, rhonchi. No retractions. No resp. distress. No accessory muscle use. ABD: S, NT, ND, +BS. No rebound. No HSM. EXTR: No c/c/e NEURO antalgic gait. Hyper-reflexive left patellar PSYCH: Normally interactive. Conversant. Not depressed or anxious appearing.  Calm demeanor.  BACK:  Tender left sciatic notch  Assessment and Plan: Sciatic neuritis Continue motrin Flexeril Percocet [facility]  Signed,  Phillips Odor, MD

## 2014-08-29 NOTE — Addendum Note (Signed)
Addended by: Carmelina Dane on: 08/29/2014 10:17 AM   Modules accepted: Orders

## 2014-11-02 ENCOUNTER — Encounter (HOSPITAL_BASED_OUTPATIENT_CLINIC_OR_DEPARTMENT_OTHER): Payer: Self-pay | Admitting: *Deleted

## 2014-11-02 ENCOUNTER — Emergency Department (HOSPITAL_BASED_OUTPATIENT_CLINIC_OR_DEPARTMENT_OTHER)
Admission: EM | Admit: 2014-11-02 | Discharge: 2014-11-02 | Disposition: A | Discharge status: Home / Self Care | Attending: Emergency Medicine | Admitting: Emergency Medicine

## 2014-11-02 DIAGNOSIS — K029 Dental caries, unspecified: Secondary | ICD-10-CM | POA: Insufficient documentation

## 2014-11-02 DIAGNOSIS — Z87448 Personal history of other diseases of urinary system: Secondary | ICD-10-CM | POA: Diagnosis not present

## 2014-11-02 DIAGNOSIS — Z79899 Other long term (current) drug therapy: Secondary | ICD-10-CM | POA: Insufficient documentation

## 2014-11-02 DIAGNOSIS — Z720 Tobacco use: Secondary | ICD-10-CM | POA: Insufficient documentation

## 2014-11-02 DIAGNOSIS — Z792 Long term (current) use of antibiotics: Secondary | ICD-10-CM | POA: Diagnosis not present

## 2014-11-02 DIAGNOSIS — Z791 Long term (current) use of non-steroidal anti-inflammatories (NSAID): Secondary | ICD-10-CM | POA: Diagnosis not present

## 2014-11-02 DIAGNOSIS — J45909 Unspecified asthma, uncomplicated: Secondary | ICD-10-CM | POA: Diagnosis not present

## 2014-11-02 DIAGNOSIS — K088 Other specified disorders of teeth and supporting structures: Secondary | ICD-10-CM | POA: Diagnosis present

## 2014-11-02 DIAGNOSIS — G8929 Other chronic pain: Secondary | ICD-10-CM | POA: Diagnosis not present

## 2014-11-02 MED ORDER — NAPROXEN SODIUM 550 MG PO TABS: 550.0000 mg | ORAL_TABLET | Freq: Two times a day (BID) | ORAL | Status: AC

## 2014-11-02 MED ORDER — HYDROMORPHONE HCL 1 MG/ML IJ SOLN
1.0000 mg | Freq: Once | INTRAMUSCULAR | Status: AC
  Administered 2014-11-02: 1 mg via INTRAMUSCULAR
  Filled 2014-11-02: qty 1

## 2014-11-02 NOTE — ED Notes (Signed)
In to room to complete d/c vitals and go over d/c paperwork. Pt was not in the room. I spoke with registration and they states pt left out of ED at 2109 with his wife as a driver. Prior nurse made pt aware that he needed to stay 30 minutes after IM Dilaudid was administered.

## 2014-11-02 NOTE — ED Notes (Addendum)
Left lower dental pain x 2-3 weeks- worse last night- pt on clindamycin

## 2014-11-02 NOTE — ED Provider Notes (Signed)
CSN: [id number]     Arrival date & time 11/02/14  1916 History   First MD Initiated Contact with Patient 11/02/14 2014     Chief Complaint  Patient presents with  . Dental Pain     (Consider location/radiation/quality/duration/timing/severity/associated sxs/prior Treatment) HPI    33 year old male with history of chronic back pain who presents complaining of dental pain. Patient reports he has been having pain to his left lower tooth ongoing for the past 3 weeks. He was seen and evaluated by his primary care doctor 2 weeks ago and was prescribed clindamycin, Percocet, voltaren.   Medication has helped however since last night the pain has gotten much worse. Pain is worse with chewing and with cold air. He has tried to schedule a dentist appointment but is not into December 15. He does have a follow-up appointment with his doctor next Wednesday. He is here requesting for pain control. He was on Suboxone however was taken off by his primary care doctor in the meantime while he is being treated for his dental pain. Patient states he does not have the funds to follow-up with a dentist. Denies any fever, sore throat, trouble swallowing, neck pain , or rash  Past Medical History  Diagnosis Date  . Status post dilation of esophageal narrowing   . Chronic back pain   . Renal disorder   . Asthma    Past Surgical History  Procedure Laterality Date  . Upper endoscopy w/ esophageal manometry    . Rhinoplasty     History reviewed. No pertinent family history. History  Substance Use Topics  . Smoking status: Current Every Day Smoker -- 0.50 packs/day    Types: Cigarettes  . Smokeless tobacco: Never Used  . Alcohol Use: Yes     Comment: social    Review of Systems  Constitutional: Negative for fever.  HENT: Positive for dental problem.   Skin: Negative for rash and wound.      Allergies  Review of patient's allergies indicates no known allergies.  Home Medications   Prior to  Admission medications   Medication Sig Start Date End Date Taking? Authorizing Provider  clindamycin (CLEOCIN) 300 MG capsule Take 300 mg by mouth 3 (three) times daily.   Yes Historical Provider, MD  diclofenac (VOLTAREN) 75 MG EC tablet Take 75 mg by mouth 2 (two) times daily.   Yes Historical Provider, MD  Fluticasone-Salmeterol (ADVAIR) 250-50 MCG/DOSE AEPB Inhale 1 puff into the lungs every 12 (twelve) hours as needed.    Yes Historical Provider, MD  traMADol (ULTRAM) 50 MG tablet Take by mouth every 6 (six) hours as needed.   Yes Historical Provider, MD  cyclobenzaprine (FLEXERIL) 10 MG tablet Take 1 tablet (10 mg total) by mouth 3 (three) times daily as needed for muscle spasms. 08/07/14   Carmelina DaneJeffery S Anderson, MD  gabapentin (NEURONTIN) 800 MG tablet Take 800 mg by mouth at bedtime.    Historical Provider, MD  naproxen sodium (ANAPROX DS) 550 MG tablet Take 1 tablet (550 mg total) by mouth 2 (two) times daily with a meal. 08/07/14 08/07/15  Carmelina DaneJeffery S Anderson, MD  oxyCODONE-acetaminophen (PERCOCET/ROXICET) 5-325 MG per tablet Take 1-2 tablets by mouth every 4 (four) hours as needed (for pain). 08/07/14   Carmelina DaneJeffery S Anderson, MD   BP 138/93 mmHg  Pulse 95  Temp(Src) 98.1 F (36.7 C) (Oral)  Resp 20  Ht 6\' 1"  (1.854 m)  Wt 176 lb (79.833 kg)  BMI 23.23 kg/m2  SpO2 100%  Physical Exam  Constitutional: He appears well-developed and well-nourished. No distress.  HENT:  Head: Atraumatic.   Mouth: dental decay noted to tooth number 18 with tenderness to palpation but no obvious abscess, no trismus, no evidence of deep tissue infection.  Eyes: Conjunctivae are normal.  Neck: Normal range of motion. Neck supple.  Lymphadenopathy:    He has no cervical adenopathy.  Neurological: He is alert.  Skin: No rash noted.  Psychiatric: He has a normal mood and affect.    ED Course  Procedures (including critical care time)  8:55 PM  patient here with dental pain secondary to pain to to dental  decay. He is currently on antibiotic and has ran out of his narcotic pain medication. I offer nerve block injection patient declined. Patient requests for narcotic pain medication  However I do not think that it is appropriate  As definitive treatment. I will provide pain management here in the ER and will prescribe NSAIDs. Dental referral given.  Labs Review Labs Reviewed - No data to display  Imaging Review No results found.   EKG Interpretation None      MDM   Final diagnoses:  Pain due to dental caries    BP 138/93 mmHg  Pulse 95  Temp(Src) 98.1 F (36.7 C) (Oral)  Resp 20  Ht 6\' 1"  (1.854 m)  Wt 176 lb (79.833 kg)  BMI 23.23 kg/m2  SpO2 100%     Fayrene HelperBowie Arturo Freundlich, PA-C 11/02/14 2059  Toy BakerAnthony T Allen, MD 11/03/14 [phone]

## 2014-11-02 NOTE — Discharge Instructions (Signed)

## 2015-02-05 ENCOUNTER — Encounter (HOSPITAL_BASED_OUTPATIENT_CLINIC_OR_DEPARTMENT_OTHER): Payer: Self-pay | Admitting: Emergency Medicine

## 2015-02-05 DIAGNOSIS — Z720 Tobacco use: Secondary | ICD-10-CM | POA: Insufficient documentation

## 2015-02-05 DIAGNOSIS — J45909 Unspecified asthma, uncomplicated: Secondary | ICD-10-CM | POA: Diagnosis not present

## 2015-02-05 DIAGNOSIS — K088 Other specified disorders of teeth and supporting structures: Secondary | ICD-10-CM | POA: Insufficient documentation

## 2015-02-05 DIAGNOSIS — Z79899 Other long term (current) drug therapy: Secondary | ICD-10-CM | POA: Diagnosis not present

## 2015-02-05 DIAGNOSIS — G8929 Other chronic pain: Secondary | ICD-10-CM | POA: Insufficient documentation

## 2015-02-05 DIAGNOSIS — Z9889 Other specified postprocedural states: Secondary | ICD-10-CM | POA: Insufficient documentation

## 2015-02-05 DIAGNOSIS — Z7951 Long term (current) use of inhaled steroids: Secondary | ICD-10-CM | POA: Diagnosis not present

## 2015-02-05 DIAGNOSIS — Z87448 Personal history of other diseases of urinary system: Secondary | ICD-10-CM | POA: Insufficient documentation

## 2015-02-05 DIAGNOSIS — Z792 Long term (current) use of antibiotics: Secondary | ICD-10-CM | POA: Diagnosis not present

## 2015-02-05 NOTE — ED Notes (Signed)
Pt with left lower tooth pain after being seen at [facility] in October for root canal and feeling, pt was unable to avoid crown

## 2015-02-06 ENCOUNTER — Encounter (HOSPITAL_BASED_OUTPATIENT_CLINIC_OR_DEPARTMENT_OTHER): Payer: Self-pay | Admitting: Emergency Medicine

## 2015-02-06 ENCOUNTER — Emergency Department (HOSPITAL_BASED_OUTPATIENT_CLINIC_OR_DEPARTMENT_OTHER)
Admission: EM | Admit: 2015-02-06 | Discharge: 2015-02-06 | Disposition: A | Discharge status: Home / Self Care | Attending: Emergency Medicine | Admitting: Emergency Medicine

## 2015-02-06 DIAGNOSIS — K0889 Other specified disorders of teeth and supporting structures: Secondary | ICD-10-CM

## 2015-02-06 MED ORDER — BUPIVACAINE-EPINEPHRINE (PF) 0.5% -1:200000 IJ SOLN
INTRAMUSCULAR | Status: AC
  Administered 2015-02-06: 1.8 mL
  Filled 2015-02-06: qty 1.8

## 2015-02-06 MED ORDER — OXYCODONE-ACETAMINOPHEN 10-325 MG PO TABS: 1.0000 | ORAL_TABLET | Freq: Four times a day (QID) | ORAL | Status: DC | PRN

## 2015-02-06 NOTE — ED Notes (Addendum)
Informed patient that narcotic medication could not be given if pt is driving, pt states that he has a ride that is coming to pick him up and provide transport home. Toddler remains at patients bedside asleep.

## 2015-02-06 NOTE — Discharge Instructions (Signed)

## 2015-02-06 NOTE — ED Provider Notes (Signed)
CSN: [id number]     Arrival date & time 02/05/15  2349 History   First MD Initiated Contact with Patient 02/06/15 0301     Chief Complaint  Patient presents with  . Dental Pain     (Consider location/radiation/quality/duration/timing/severity/associated sxs/prior Treatment) HPI  This is a 34 year old male with a history of chronic back pain. He was previously on oxycodone 15 milligrams 4 times a day as prescribed by the [place]. He states that being on chronic narcotics was very dysphoric and he discontinued them. He has been on Suboxone therapy by his primary care physician who was licensed to treat with Suboxone. His primary care physician has also prescribed narcotics when needed for acute exacerbations of pain. This was verified with the [facility] controlled substances database.   He had dental work done on his left lower first molar with the intention of completing a root canal and getting a crown. When it became evident that this would require 3 visits at $500 each he had to cancel these plans because he could not afford it. He has had pain in that tooth for the past 2 days which she describes as severe and unrelenting. He said it hurts to talk. The pain radiates to the left side of his face. He has an appointment in 5 days to have that tooth pulled. He is requesting pain management until then.  Past Medical History  Diagnosis Date  . Status post dilation of esophageal narrowing   . Chronic back pain   . Renal disorder   . Asthma    Past Surgical History  Procedure Laterality Date  . Upper endoscopy w/ esophageal manometry    . Rhinoplasty     History reviewed. No pertinent family history. History  Substance Use Topics  . Smoking status: Current Every Day Smoker -- 0.50 packs/day    Types: Cigarettes  . Smokeless tobacco: Never Used  . Alcohol Use: Yes     Comment: social    Review of Systems  All other systems reviewed and are negative.   Allergies  Review of  patient's allergies indicates no known allergies.  Home Medications   Prior to Admission medications   Medication Sig Start Date End Date Taking? Authorizing Provider  buprenorphine-naloxone (SUBOXONE) 2-0.5 MG SUBL SL tablet Place 1 tablet under the tongue daily.   Yes Historical Provider, MD  ibuprofen (ADVIL,MOTRIN) 800 MG tablet Take 800 mg by mouth every 8 (eight) hours as needed.   Yes Historical Provider, MD  clindamycin (CLEOCIN) 300 MG capsule Take 300 mg by mouth 3 (three) times daily.    Historical Provider, MD  cyclobenzaprine (FLEXERIL) 10 MG tablet Take 1 tablet (10 mg total) by mouth 3 (three) times daily as needed for muscle spasms. 08/07/14   Carmelina Dane, MD  Fluticasone-Salmeterol (ADVAIR) 250-50 MCG/DOSE AEPB Inhale 1 puff into the lungs every 12 (twelve) hours as needed.     Historical Provider, MD  gabapentin (NEURONTIN) 800 MG tablet Take 800 mg by mouth at bedtime.    Historical Provider, MD  naproxen sodium (ANAPROX DS) 550 MG tablet Take 1 tablet (550 mg total) by mouth 2 (two) times daily with a meal. 11/02/14 11/02/15  Fayrene Helper, PA-C  oxyCODONE-acetaminophen (PERCOCET/ROXICET) 5-325 MG per tablet Take 1-2 tablets by mouth every 4 (four) hours as needed (for pain). 08/07/14   Carmelina Dane, MD  traMADol (ULTRAM) 50 MG tablet Take by mouth every 6 (six) hours as needed.    Historical Provider, MD  Temp(Src) 98.4 F (36.9 C) (Oral)  Ht 6\' 1"  (1.854 m)  Wt 190 lb (86.183 kg)  BMI 25.07 kg/m2   Physical Exam  General: Well-developed, well-nourished male in no acute distress; appearance consistent with age of record HENT: normocephalic; atraumatic; left lower first molar with temporary filling and tenderness to percussion Eyes: pupils equal, round and reactive to light; extraocular muscles intact Neck: supple; no lymphadenopathy Heart: regular rate and rhythm Lungs: normal respiratory effort and excursion Abdomen: soft; nondistended Extremities: No  deformity; full range of motion Neurologic: Awake, alert and oriented; motor function intact in all extremities and symmetric; no facial droop Skin: Warm and dry Psychiatric: angry mood with congruent affect    ED Course  Procedures (including critical care time)  DENTAL BLOCK 1.8 milliliters of 0.5% proparacaine with epinephrine were injected into the buccal fold adjacent to the left lower first molar. There were no immediate complications the patient tolerated this well. He experienced only limited analgesia.  MDM  The patient was advised of limitations placed on emergency department is prescribing of narcotics, particularly to someone with a known history of narcotic dependence. His explanation of being on Suboxone with intermittent acute pain treatment by his PCP was verified with the state database. Given his acute painful condition we will treat his pain pending his scheduled extraction.       Hanley SeamenJohn L Tifini Reeder, MD 02/06/15 [phone]

## 2015-07-06 ENCOUNTER — Emergency Department (HOSPITAL_BASED_OUTPATIENT_CLINIC_OR_DEPARTMENT_OTHER)
Admission: EM | Admit: 2015-07-06 | Discharge: 2015-07-06 | Disposition: A | Discharge status: Home / Self Care | Attending: Emergency Medicine | Admitting: Emergency Medicine

## 2015-07-06 ENCOUNTER — Encounter (HOSPITAL_BASED_OUTPATIENT_CLINIC_OR_DEPARTMENT_OTHER): Payer: Self-pay | Admitting: *Deleted

## 2015-07-06 DIAGNOSIS — Z720 Tobacco use: Secondary | ICD-10-CM | POA: Diagnosis not present

## 2015-07-06 DIAGNOSIS — Z7951 Long term (current) use of inhaled steroids: Secondary | ICD-10-CM | POA: Insufficient documentation

## 2015-07-06 DIAGNOSIS — Z9889 Other specified postprocedural states: Secondary | ICD-10-CM | POA: Diagnosis not present

## 2015-07-06 DIAGNOSIS — G8929 Other chronic pain: Secondary | ICD-10-CM | POA: Insufficient documentation

## 2015-07-06 DIAGNOSIS — K088 Other specified disorders of teeth and supporting structures: Secondary | ICD-10-CM | POA: Insufficient documentation

## 2015-07-06 DIAGNOSIS — K029 Dental caries, unspecified: Secondary | ICD-10-CM | POA: Insufficient documentation

## 2015-07-06 DIAGNOSIS — Z79899 Other long term (current) drug therapy: Secondary | ICD-10-CM | POA: Diagnosis not present

## 2015-07-06 DIAGNOSIS — J45909 Unspecified asthma, uncomplicated: Secondary | ICD-10-CM | POA: Diagnosis not present

## 2015-07-06 DIAGNOSIS — Z87448 Personal history of other diseases of urinary system: Secondary | ICD-10-CM | POA: Insufficient documentation

## 2015-07-06 HISTORY — DX: Gastro-esophageal reflux disease without esophagitis: K21.9

## 2015-07-06 MED ORDER — BUPIVACAINE-EPINEPHRINE (PF) 0.5% -1:200000 IJ SOLN
1.8000 mL | Freq: Once | INTRAMUSCULAR | Status: AC
  Administered 2015-07-06: 1.8 mL
  Filled 2015-07-06: qty 1.8

## 2015-07-06 MED ORDER — OXYCODONE-ACETAMINOPHEN 10-325 MG PO TABS: 1.0000 | ORAL_TABLET | ORAL | Status: DC | PRN

## 2015-07-06 NOTE — ED Provider Notes (Addendum)
CSN: [id number]     Arrival date & time 07/06/15  8119 History   First MD Initiated Contact with Patient 07/06/15 [phone]     Chief Complaint  Patient presents with  . Dental Pain     (Consider location/radiation/quality/duration/timing/severity/associated sxs/prior Treatment) HPI  This is a 34 year old male with a history of narcotic dependence on Suboxone therapy. He had a filling fall out of his right lower first molar about a month ago. He is here with 3 days of pain in that tooth. He states the pain is severe. It is worse with eating or drinking. He denies systemic symptoms such as fever, chills, nausea or vomiting. He has stopped taking Suboxone for the past 3 days in anticipation of requiring something stronger and avoiding Suboxone's opioid antagonist effects. His pain management physician is currently out of town. His pain management physician does switch him from Suboxone to a full agonist opioid as needed when the patient is having an acute pain episode; this was confirmed by consulting the [place] state controlled substances database.   Past Medical History  Diagnosis Date  . Status post dilation of esophageal narrowing   . Chronic back pain   . Renal disorder   . Asthma   . Acid reflux    Past Surgical History  Procedure Laterality Date  . Upper endoscopy w/ esophageal manometry    . Rhinoplasty     History reviewed. No pertinent family history. History  Substance Use Topics  . Smoking status: Current Every Day Smoker -- 0.50 packs/day    Types: Cigarettes  . Smokeless tobacco: Never Used  . Alcohol Use: Yes     Comment: social    Review of Systems  All other systems reviewed and are negative.   Allergies  Review of patient's allergies indicates no known allergies.  Home Medications   Prior to Admission medications   Medication Sig Start Date End Date Taking? Authorizing Provider  buprenorphine-naloxone (SUBOXONE) 2-0.5 MG SUBL SL tablet Place 1 tablet  under the tongue daily.    Historical Provider, MD  clindamycin (CLEOCIN) 300 MG capsule Take 300 mg by mouth 3 (three) times daily.    Historical Provider, MD  Fluticasone-Salmeterol (ADVAIR) 250-50 MCG/DOSE AEPB Inhale 1 puff into the lungs every 12 (twelve) hours as needed.     Historical Provider, MD  gabapentin (NEURONTIN) 800 MG tablet Take 800 mg by mouth at bedtime.    Historical Provider, MD  ibuprofen (ADVIL,MOTRIN) 800 MG tablet Take 800 mg by mouth every 8 (eight) hours as needed.    Historical Provider, MD  naproxen sodium (ANAPROX DS) 550 MG tablet Take 1 tablet (550 mg total) by mouth 2 (two) times daily with a meal. 11/02/14 11/02/15  Fayrene Helper, PA-C  oxyCODONE-acetaminophen (PERCOCET) 10-325 MG per tablet Take 1 tablet by mouth every 6 (six) hours as needed for pain. 02/06/15   Gilmer Kaminsky, MD  traMADol (ULTRAM) 50 MG tablet Take by mouth every 6 (six) hours as needed.    Historical Provider, MD   BP 104/51 mmHg  Pulse 92  Temp(Src) 98.2 F (36.8 C) (Oral)  Resp 17  Ht  (1.854 m)  Wt 181 lb (82.101 kg)  BMI 23.89 kg/m2  SpO2 96%   Physical Exam  General: Well-developed, well-nourished male in no acute distress; appearance consistent with age of record HENT: normocephalic; atraumatic; carious right lower first molar with tenderness to percussion Eyes: pupils equal, round and reactive to light; extraocular muscles intact Neck: supple  Heart: regular rate and rhythm Lungs: Normal respiratory effort and excursion Abdomen: soft; nondistended Extremities: No deformity; full range of motion Neurologic: Awake, alert and oriented; motor function intact in all extremities and symmetric; no facial droop Skin: Warm and dry    ED Course  Procedures (including critical care time)  DENTAL BLOCK 1.8 milliliters of 0.5% bupivacaine with epinephrine were injected into the buccal fold adjacent to the right lower first molar. The patient tolerated this well and there were no  immediate complications. Adequate analgesia was obtained.  MDM  The patient was advised that there are new restrictions in place both by the [facility] of medicine as well as our Wellsite geologist. These restrictions limit treatment of acute pain in chronic pain management patients to 1 business day. He was advised that we will provide one day of [place] pain medicine with the anticipation that he will follow-up with the dentist on call tomorrow.   Paula Libra, MD 07/06/15 1610  Paula Libra, MD 07/06/15 9604  Paula Libra, MD 07/06/15 [phone]

## 2015-07-06 NOTE — ED Notes (Signed)
Pt of Dr. Theo Dills @ [place]. Seen in [place] and [place]. Does not have a dentist. Alert, NAD, calm, interactive, c/o R lower dental pain with filling missing. (denies: sob, nv, fever, dizziness, drainage or swelling).

## 2015-07-06 NOTE — Discharge Instructions (Signed)

## 2016-01-23 MED FILL — SUBOXONE 8 MG-2 MG SL FILM: 8-2 | 14 days supply | Qty: 42 | Fill #0

## 2016-02-06 MED FILL — SUBOXONE 8 MG-2 MG SL FILM: 8-2 | 30 days supply | Qty: 90 | Fill #0

## 2016-03-05 MED FILL — SUBOXONE 8 MG-2 MG SL FILM: 8-2 | 28 days supply | Qty: 84 | Fill #0

## 2016-03-16 ENCOUNTER — Encounter: Payer: Self-pay | Admitting: Emergency Medicine

## 2016-03-16 ENCOUNTER — Emergency Department
Admission: EM | Admit: 2016-03-16 | Discharge: 2016-03-16 | Disposition: A | Discharge status: Home / Self Care | Attending: Emergency Medicine | Admitting: Emergency Medicine

## 2016-03-16 DIAGNOSIS — Z791 Long term (current) use of non-steroidal anti-inflammatories (NSAID): Secondary | ICD-10-CM | POA: Insufficient documentation

## 2016-03-16 DIAGNOSIS — G8929 Other chronic pain: Secondary | ICD-10-CM | POA: Diagnosis not present

## 2016-03-16 DIAGNOSIS — M549 Dorsalgia, unspecified: Secondary | ICD-10-CM | POA: Insufficient documentation

## 2016-03-16 DIAGNOSIS — J45909 Unspecified asthma, uncomplicated: Secondary | ICD-10-CM | POA: Insufficient documentation

## 2016-03-16 DIAGNOSIS — F1120 Opioid dependence, uncomplicated: Secondary | ICD-10-CM | POA: Diagnosis present

## 2016-03-16 DIAGNOSIS — F17210 Nicotine dependence, cigarettes, uncomplicated: Secondary | ICD-10-CM | POA: Diagnosis not present

## 2016-03-16 DIAGNOSIS — Z79899 Other long term (current) drug therapy: Secondary | ICD-10-CM | POA: Diagnosis not present

## 2016-03-16 NOTE — Discharge Instructions (Signed)
Finding Treatment for Addiction WHAT IS ADDICTION? Addiction is a complex disease of the brain. It causes an uncontrollable (compulsive) need for a substance. You can be addicted to alcohol, illegal drugs, or prescription medicines such as painkillers. Addiction can also be a behavior, like gambling or shopping. The need for the drug or activity can become so strong that you think about it all the time. You can also become physically dependent on a substance. Addiction can change the way your brain works. Because of these changes, getting more of whatever you are addicted to becomes the most important thing to you and feels better than other activities or relationships. Addiction can lead to changes in health, behavior, emotions, relationships, and choices that affect you and everyone around you. HOW DO I KNOW IF I NEED TREATMENT FOR ADDICTION? Addiction is a progressive disease. Without treatment, addiction can get worse. Living with addiction puts you at higher risk for injury, poor health, lost employment, loss of money, and even death. You might need treatment for addiction if:  You have tried to stop or cut down, but you cannot.  Your addiction is causing physical health problems.  You find it annoying that your friends and family are concerned about your alcohol or substance use.  You feel guilty about substance abuse or a compulsive behavior.  You have lied or tried to hide your addiction.  You need a particular substance or activity to start your day or to calm down.  You are getting in trouble at school, work, home, or with the police.  You have done something illegal to support your addiction.  You are running out of money because of your addiction.  You have no time for anything other than your addiction. WHAT TYPES OF TREATMENT ARE AVAILABLE? The treatment program that is right for you will depend on many factors, including the type of addiction you have. Treatment programs  can be outpatient or inpatient. In an outpatient program, you live at home and go to work or school, but you also go to a clinic for treatment. With an inpatient program, you live and sleep at the program facility during treatment. After treatment, you might need a plan for support during recovery. Other treatment options include:   Medicine.  Some addictions may be treated with prescription medicines.  You might also need medicine to treat anxiety or depression.  Counseling and behavior therapy. Therapy can help individuals and families behave in healthier ways and relate more effectively.  Support groups. Confidential group therapy, such as a 12-step program, can help individuals and families during treatment and recovery. No single type of program is right for everyone. Many treatment programs involve a combination of education, counseling, and a 12-step, spiritually-based approach. Some treatment programs are government sponsored. They are geared for patients who do not have private insurance. Treatment programs can vary in many respects, such as:  Cost and types of insurance that are accepted.  Types of on-site medical services that are offered.  Length of stay, setting, and size.  Overall philosophy of treatment. WHAT SHOULD I CONSIDER WHEN SELECTING A TREATMENT PROGRAM? It is important to think about your individual requirements when selecting a treatment program. There are a number of things to consider, such as:  If the program is certified by the appropriate government agency. Even private programs must be certified and employ certified professionals.  If the program is covered by your insurance. If finances are a concern, the first call you should make  is to [employer], if you have health insurance. Ask for a list of treatment programs that are in your network, and confirm any copayments and deductibles that you may have to pay.  If you do not have insurance, or if  you choose to attend a program that does not accept your insurance, discuss whether a payment plan can be set up.  If treatment is available in languages other than English, if needed.  If the program offers detoxification treatment, if needed.  If 12-step meetings are held at the center or if transport is available for patients to attend meetings at other locations.  If the program is professional, organized, and clean.  If the program meets all of your needs, including physical and cultural needs.  If the facility offers specific treatment for your particular addiction.  If support continues to be offered after you have left the program.  If your treatment plan is continually looked at to make sure you are receiving the right treatment at the right time.  If mental health counseling is part of your treatment.  If medicine is included in treatment, if needed.  If your family is included in your treatment plan and if support is offered to them throughout the treatment process.  How the treatment works to prevent relapse. WHERE ELSE CAN I GET HELP?  Your health care provider. Ask him or her to help you find addiction treatment. These discussions are confidential.  The [employer] on Alcoholism and Drug Dependence (NCADD). This group has information about treatment centers and programs for people who have an addiction and for family members.  The telephone number is 1-800-NCA-CALL (385 730 0755).  The website is https://ncadd.org/about-ncadd/our-affiliates  The Substance Abuse and Mental Health Services Administration [facility]). This group will help you find publicly funded treatment centers, help hotlines, and counseling services near you.  The telephone number is 1-800-662-HELP (724-279-5980).  The website is www.findtreatment.Rock[employer].pl In countries outside of the [place]. and [place], look in [employer] for contact information for services in your area.   This  information is not intended to replace advice given to you by your health care provider. Make sure you discuss any questions you have with your health care provider.   Document Released: 10/28/2005 Document Revised: 08/20/2015 Document Reviewed: 09/17/2014 Elsevier Interactive Patient Education 2016 Elsevier Inc.  Chemical Dependency Chemical dependency is an addiction to drugs or alcohol. It is characterized by the repeated behavior of seeking out and using drugs and alcohol despite harmful consequences to the health and safety of ones self and others.  RISK FACTORS There are certain situations or behaviors that increase a person's risk for chemical dependency. These include:  A family history of chemical dependency.  A history of mental health issues, including depression and anxiety.  A home environment where drugs and alcohol are easily available to you.  Drug or alcohol use at a young age. SYMPTOMS  The following symptoms can indicate chemical dependency:  Inability to limit the use of drugs or alcohol.  Nausea, sweating, shakiness, and anxiety that occurs when alcohol or drugs are not being used.  An increase in amount of drugs or alcohol that is necessary to get drunk or high. People who experience these symptoms can assess their use of drugs and alcohol by asking themselves the following questions:  Have you been told by friends or family that they are worried about your use of alcohol or drugs?  Do friends and family ever tell you about things  you did while drinking alcohol or using drugs that you do not remember?  Do you lie about using alcohol or drugs or about the amounts you use?  Do you have difficulty completing daily tasks unless you use alcohol or drugs?  Is the level of your work or school performance lower because of your drug or alcohol use?  Do you get sick from using drugs or alcohol but keep using anyway?  Do you feel uncomfortable in social situations  unless you use alcohol or drugs?  Do you use drugs or alcohol to help forget problems? An answer of yes to any of these questions may indicate chemical dependency. Professional evaluation is suggested.   This information is not intended to replace advice given to you by your health care provider. Make sure you discuss any questions you have with your health care provider.   Document Released: 11/23/2001 Document Revised: 02/21/2012 Document Reviewed: 02/04/2011 Elsevier Interactive Patient Education 2016 Elsevier Inc.  Opioid Use Disorder Opioid use disorder is a mental disorder. It is the continued nonmedical use of opioids in spite of risks to health and well-being. Misused opioids include the street drug heroin. They also include pain medicines such as morphine, hydrocodone, oxycodone, and fentanyl. Opioids are very addictive. People who misuse opioids get an exaggerated feeling of well-being. Opioid use disorder often disrupts activities at home, work, or school. It may cause mental or physical problems.  A family history of opioid use disorder puts you at higher risk of it. People with opioid use disorder often misuse other drugs or have mental illness such as depression, posttraumatic stress disorder, or antisocial personality disorder. They also are at risk of suicide and death from overdose. SIGNS AND SYMPTOMS  Signs and symptoms of opioid use disorder include:  Use of opioids in larger amounts or over a longer period than intended.  Unsuccessful attempts to cut down or control opioid use.  A lot of time spent obtaining, using, or recovering from the effects of opioids.  A strong desire or urge to use opioids (craving).  Continued use of opioids in spite of major problems at work, school, or home because of use.  Continued use of opioids in spite of relationship problems because of use.  Giving up or cutting down on important life activities because of opioid use.  Use of  opioids over and over in situations when it is physically hazardous, such as driving a car.  Continued use of opioids in spite of a physical problem that is likely related to use. Physical problems can include:  Severe constipation.  Poor nutrition.  Infertility.  Tuberculosis.  Aspiration pneumonia.  Infections such as human immunodeficiency virus (HIV) and hepatitis (from injecting opioids).  Continued use of opioids in spite of a mental problem that is likely related to use. Mental problems can include:  Depression.  Anxiety.  Hallucinations.  Sleep problems.  Loss of sexual function.  Need to use more and more opioids to get the same effect, or lessened effect over time with use of the same amount (tolerance).  Having withdrawal symptoms when opioid use is stopped, or using opioids to reduce or avoid withdrawal symptoms. Withdrawal symptoms include:  Depressed, anxious, or irritable mood.  Nausea, vomiting, diarrhea, or intestinal cramping.  Muscle aches or spasms.  Excessive tearing or runny nose.  Dilated pupils, sweating, or hairs standing on end.  Yawning.  Fever, raised blood pressure, or fast pulse.  Restlessness or trouble sleeping. This does not apply to  people taking opioids for medical reasons only. DIAGNOSIS Opioid use disorder is diagnosed by your health care provider. You may be asked questions about your opioid use and and how it affects your life. A physical exam may be done. A drug screen may be ordered. You may be referred to a mental health professional. The diagnosis of opioid use disorder requires at least two symptoms within 12 months. The type of opioid use disorder you have depends on the number of signs and symptoms you have. The type may be:  Mild. Two or three signs and symptoms.   Moderate. Four or five signs and symptoms.   Severe. Six or more signs and symptoms. TREATMENT  Treatment is usually provided by mental health  professionals with training in substance use disorders.The following options are available:  Detoxification.This is the first step in treatment for withdrawal. It is medically supervised withdrawal with the use of medicines. These medicines lessen withdrawal symptoms. They also raise the chance of becoming opioid free.  Counseling, also known as talk therapy. Talk therapy addresses the reasons you use opioids. It also addresses ways to keep you from using again (relapse). The goals of talk therapy are to avoid relapse by:  Identifying and avoiding triggers for use.  Finding healthy ways to cope with stress.  Learning how to handle cravings.  Support groups. Support groups provide emotional support, advice, and guidance.  A medicine that blocks opioid receptors in your brain. This medicine can reduce opioid cravings that lead to relapse. This medicine also blocks the desired opioid effect when relapse occurs.  Opioids that are taken by mouth in place of the misused opioid (opioid maintenance treatment). These medicines satisfy cravings but are safer than commonly misused opioids. This often is the best option for people who continue to relapse with other treatments. HOME CARE INSTRUCTIONS   Take medicines only as directed by your health care provider.  Check with your health care provider before starting new medicines.  Keep all follow-up visits as directed by your health care provider. SEEK MEDICAL CARE IF:  You are not able to take your medicines as directed.  Your symptoms get worse. SEEK IMMEDIATE MEDICAL CARE IF:  You have serious thoughts about hurting yourself or others.  You may have taken an overdose of opioids. FOR MORE INFORMATION  National Institute on Drug Abuse: http://www.price-smith.com/www.drugabuse.gov  Substance Abuse and Mental Health Services Administration: [employer].com.ptwww.samhsa.gov   This information is not intended to replace advice given to you by your health care provider. Make sure you  discuss any questions you have with your health care provider.   Document Released: 09/26/2007 Document Revised: 12/20/2014 Document Reviewed: 12/12/2013 Elsevier Interactive Patient Education [employer] Elsevier Inc.

## 2016-03-16 NOTE — ED Notes (Signed)
RN entered room to D/C pt. Pt was not in room and all belongings had been removed from room. Pt was ambulatory and was in no acute distress at the last time he was seen by MD and RN. Signiture was not able to be obtained and vitals could not be updated.

## 2016-03-16 NOTE — ED Provider Notes (Signed)
[facility] Emergency Department Provider Note  ____________________________________________  Time seen: 4:10 PM  I have reviewed the triage vital signs and the nursing notes.   HISTORY  Chief Complaint Addiction Problem    HPI Scott Brock is a 35 y.o. male who reports daily intranasal heroin use and requests treatment for addiction.He is hoping to be admitted for inpatient supervised detox. He reports that he is to be in [employer] where he sustained a back injury and had been on opioid pain medications for 3 years. The cousin that he became dependent on them. His doctor transitioned him to Suboxone in an attempt to wean him from opioids. However, more recently he started using heroin and has since been addicted and keeps wanting more. He denies any history of IV drug use. Smokes but only occasional alcohol use. Denies SI HI or hallucinations.  Denies abdominal pain nausea vomiting diarrhea. No chest pain shortness of breath fever chills or dizziness. He's been eating and drinking okay.     Past Medical History  Diagnosis Date  . Status post dilation of esophageal narrowing   . Chronic back pain   . Renal disorder   . Asthma   . Acid reflux      There are no active problems to display for this patient.    Past Surgical History  Procedure Laterality Date  . Upper endoscopy w/ esophageal manometry    . Rhinoplasty       Current Outpatient Rx  Name  Route  Sig  Dispense  Refill  . buprenorphine-naloxone (SUBOXONE) 2-0.5 MG SUBL SL tablet   Sublingual   Place 1 tablet under the tongue daily.         . clindamycin (CLEOCIN) 300 MG capsule   Oral   Take 300 mg by mouth 3 (three) times daily.         . Fluticasone-Salmeterol (ADVAIR) 250-50 MCG/DOSE AEPB   Inhalation   Inhale 1 puff into the lungs every 12 (twelve) hours as needed.          . gabapentin (NEURONTIN) 800 MG tablet   Oral   Take 800 mg by mouth at bedtime.         Marland Kitchen  ibuprofen (ADVIL,MOTRIN) 800 MG tablet   Oral   Take 800 mg by mouth every 8 (eight) hours as needed.         Marland Kitchen oxyCODONE-acetaminophen (PERCOCET) 10-325 MG per tablet   Oral   Take 1 tablet by mouth every 4 (four) hours as needed for pain.   6 tablet   0   . traMADol (ULTRAM) 50 MG tablet   Oral   Take by mouth every 6 (six) hours as needed.            Allergies Review of patient's allergies indicates no known allergies.   No family history on file.  Social History Social History  Substance Use Topics  . Smoking status: Current Every Day Smoker -- 0.50 packs/day    Types: Cigarettes  . Smokeless tobacco: Never Used  . Alcohol Use: Yes     Comment: social    Review of Systems  Constitutional:   No fever or chills. No weight changes Eyes:   No vision changes.  ENT:   No sore throat. No rhinorrhea. Cardiovascular:   No chest pain. Respiratory:   No dyspnea or cough. Gastrointestinal:   Negative for abdominal pain, vomiting and diarrhea.  No BRBPR or melena. Genitourinary:   Negative  for dysuria or difficulty urinating. Musculoskeletal:   Negative for focal pain or swelling Skin:   Negative for rash. Neurological:   Negative for headaches, focal weakness or numbness.  10-point ROS otherwise negative.  ____________________________________________   PHYSICAL EXAM:  VITAL SIGNS: ED Triage Vitals  Enc Vitals Group     BP 03/16/16 1541 115/59 mmHg     Pulse Rate 03/16/16 1541 88     Resp 03/16/16 1541 18     Temp 03/16/16 1541 99.1 F (37.3 C)     Temp Source 03/16/16 1541 Oral     SpO2 03/16/16 1541 99 %     Weight 03/16/16 1541 181 lb (82.101 kg)     Height 03/16/16 1541 6\' 1"  (1.854 m)     Head Cir --      Peak Flow --      Pain Score --      Pain Loc --      Pain Edu? --      Excl. in GC? --     Vital signs reviewed, nursing assessments reviewed.   Constitutional:   Alert and oriented. Well appearing and in no distress. Eyes:   No scleral  icterus. No conjunctival pallor. PERRL. EOMI ENT   Head:   Normocephalic and atraumatic.   Nose:   No congestion/rhinnorhea. No septal hematoma   Mouth/Throat:   MMM, no pharyngeal erythema. No peritonsillar mass.    Neck:   No stridor. No SubQ emphysema. No meningismus. Hematological/Lymphatic/Immunilogical:   No cervical lymphadenopathy. Cardiovascular:   RRR. Symmetric bilateral radial and DP pulses.  No murmurs.  Respiratory:   Normal respiratory effort without tachypnea nor retractions. Breath sounds are clear and equal bilaterally. No wheezes/rales/rhonchi. Gastrointestinal:   Soft and nontender. Non distended. There is no CVA tenderness.  No rebound, rigidity, or guarding.Normoactive bowel sounds Genitourinary:   deferred Musculoskeletal:   Nontender with normal range of motion in all extremities. No joint effusions.  No lower extremity tenderness.  No edema. Neurologic:   Normal speech and language.  CN 2-10 normal. Motor grossly intact. No gross focal neurologic deficits are appreciated.  Skin:    Skin is warm, dry and intact. No rash noted.  No petechiae, purpura, or bullae. No track marks Psychiatric:   Mood and affect are normal. ____________________________________________    LABS (pertinent positives/negatives) (all labs ordered are listed, but only abnormal results are displayed) Labs Reviewed - No data to display ____________________________________________   EKG    ____________________________________________    RADIOLOGY    ____________________________________________   PROCEDURES   ____________________________________________   INITIAL IMPRESSION / ASSESSMENT AND PLAN / ED COURSE  Pertinent labs & imaging results that were available during my care of the patient were reviewed by me and considered in my medical decision making (see chart for details).  Patient presents with uncomplicated opiate dependence due to intranasal heroin use.  No acute symptoms at this time. Vital signs are normal. Exam is benign. Counseled the patient extensively, for about 10 minutes on his opiate abuse and dependence and seeking treatment. He is motivated to stop and is just looking for the right resources. Unfortunately, we do not have inpatient medically supervised detox [facility]. 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 He reports that he'll just go straight to [facility] where  he believes they do have inpatient detox. I offered him clonidine antiemetics and Imodium prescriptions which he declines. He is medically stable for discharge.     ____________________________________________   FINAL CLINICAL IMPRESSION(S) / ED DIAGNOSES  Final diagnoses:  Uncomplicated opioid dependence (HCC)      Sharman Cheek, MD 03/16/16 [phone]

## 2016-03-16 NOTE — ED Notes (Signed)
States he is requesting help with drug   Last use of heiron was PTA

## 2016-03-19 ENCOUNTER — Encounter: Payer: Self-pay | Admitting: *Deleted

## 2016-03-19 DIAGNOSIS — R45851 Suicidal ideations: Secondary | ICD-10-CM | POA: Diagnosis present

## 2016-03-19 DIAGNOSIS — F11259 Opioid dependence with opioid-induced psychotic disorder, unspecified: Secondary | ICD-10-CM | POA: Insufficient documentation

## 2016-03-19 DIAGNOSIS — G8929 Other chronic pain: Secondary | ICD-10-CM | POA: Insufficient documentation

## 2016-03-19 DIAGNOSIS — J45909 Unspecified asthma, uncomplicated: Secondary | ICD-10-CM | POA: Insufficient documentation

## 2016-03-19 DIAGNOSIS — M549 Dorsalgia, unspecified: Secondary | ICD-10-CM | POA: Insufficient documentation

## 2016-03-19 DIAGNOSIS — F12159 Cannabis abuse with psychotic disorder, unspecified: Secondary | ICD-10-CM | POA: Diagnosis not present

## 2016-03-19 DIAGNOSIS — F17210 Nicotine dependence, cigarettes, uncomplicated: Secondary | ICD-10-CM | POA: Diagnosis not present

## 2016-03-19 NOTE — ED Notes (Signed)
Pt states he wants to detox from heroin. Pt states he feels suicidal secondary to heroin addiction. Pt endorses vague plan for driving off the road. Pt is veteran. Pt is living with mother at this time, effectively homeless. Pt last used heroin at 2200 today. Pt states he "snorts" heroin. Pt's typical use is between 2-3 grams daily.

## 2016-03-19 NOTE — ED Notes (Signed)
Pt ambulatory to registration desk with no difficulty. Pt asking for detox.

## 2016-03-20 ENCOUNTER — Encounter: Payer: Self-pay | Admitting: Psychiatry

## 2016-03-20 ENCOUNTER — Inpatient Hospital Stay
Admission: RE | Admit: 2016-03-20 | Discharge: 2016-03-24 | DRG: 885 | Disposition: A | Discharge status: Home / Self Care | Source: Intra-hospital | Attending: Psychiatry | Admitting: Psychiatry

## 2016-03-20 ENCOUNTER — Emergency Department
Admission: EM | Admit: 2016-03-20 | Discharge: 2016-03-20 | Disposition: A | Discharge status: Other Acute Inpatient Hospital | Attending: Emergency Medicine | Admitting: Emergency Medicine

## 2016-03-20 DIAGNOSIS — J45909 Unspecified asthma, uncomplicated: Secondary | ICD-10-CM | POA: Diagnosis present

## 2016-03-20 DIAGNOSIS — F17210 Nicotine dependence, cigarettes, uncomplicated: Secondary | ICD-10-CM | POA: Diagnosis present

## 2016-03-20 DIAGNOSIS — F17200 Nicotine dependence, unspecified, uncomplicated: Secondary | ICD-10-CM | POA: Diagnosis present

## 2016-03-20 DIAGNOSIS — R45851 Suicidal ideations: Secondary | ICD-10-CM | POA: Diagnosis present

## 2016-03-20 DIAGNOSIS — F329 Major depressive disorder, single episode, unspecified: Secondary | ICD-10-CM | POA: Diagnosis present

## 2016-03-20 DIAGNOSIS — F1120 Opioid dependence, uncomplicated: Secondary | ICD-10-CM | POA: Diagnosis present

## 2016-03-20 DIAGNOSIS — G4700 Insomnia, unspecified: Secondary | ICD-10-CM | POA: Diagnosis present

## 2016-03-20 DIAGNOSIS — F1123 Opioid dependence with withdrawal: Secondary | ICD-10-CM | POA: Diagnosis present

## 2016-03-20 DIAGNOSIS — F11259 Opioid dependence with opioid-induced psychotic disorder, unspecified: Secondary | ICD-10-CM | POA: Diagnosis not present

## 2016-03-20 DIAGNOSIS — F322 Major depressive disorder, single episode, severe without psychotic features: Principal | ICD-10-CM

## 2016-03-20 LAB — COMPREHENSIVE METABOLIC PANEL
ALK PHOS: 82 U/L (ref 38–126)
ALT: 30 U/L (ref 17–63)
AST: 22 U/L (ref 15–41)
Albumin: 4.5 g/dL (ref 3.5–5.0)
Anion gap: 4 — ABNORMAL LOW (ref 5–15)
BUN: 15 mg/dL (ref 6–20)
CALCIUM: 9.2 mg/dL (ref 8.9–10.3)
CO2: 29 mmol/L (ref 22–32)
CREATININE: 0.9 mg/dL (ref 0.61–1.24)
Chloride: 102 mmol/L (ref 101–111)
GFR calc non Af Amer: >60 mL/min (ref >60)
GLUCOSE: 107 mg/dL — AB (ref 65–99)
Potassium: 4.1 mmol/L (ref 3.5–5.1)
Sodium: 135 mmol/L (ref 135–145)
Total Bilirubin: 0.4 mg/dL (ref 0.3–1.2)
Total Protein: 7.2 g/dL (ref 6.5–8.1)

## 2016-03-20 LAB — CBC
HCT: 47.4 % (ref 40.0–52.0)
Hemoglobin: 16.4 g/dL (ref 13.0–18.0)
MCH: 32.2 pg (ref 26.0–34.0)
MCHC: 34.7 g/dL (ref 32.0–36.0)
MCV: 93 fL (ref 80.0–100.0)
PLATELETS: 216 10*3/uL (ref 150–440)
RBC: 5.09 MIL/uL (ref 4.40–5.90)
RDW: 13 % (ref 11.5–14.5)
WBC: 13.3 10*3/uL — ABNORMAL HIGH (ref 3.8–10.6)

## 2016-03-20 LAB — URINE DRUG SCREEN, QUALITATIVE (ARMC ONLY)
Amphetamines, Ur Screen: NOT DETECTED
BARBITURATES, UR SCREEN: NOT DETECTED
BENZODIAZEPINE, UR SCRN: NOT DETECTED
Cannabinoid 50 Ng, Ur ~~LOC~~: NOT DETECTED
Cocaine Metabolite,Ur ~~LOC~~: NOT DETECTED
MDMA (Ecstasy)Ur Screen: NOT DETECTED
Methadone Scn, Ur: NOT DETECTED
Opiate, Ur Screen: POSITIVE — AB
Phencyclidine (PCP) Ur S: NOT DETECTED
Tricyclic, Ur Screen: NOT DETECTED

## 2016-03-20 LAB — ACETAMINOPHEN LEVEL: Acetaminophen (Tylenol), Serum: <10 ug/mL — ABNORMAL LOW (ref 10–30)

## 2016-03-20 LAB — ETHANOL: Alcohol, Ethyl (B): <5 mg/dL (ref <5)

## 2016-03-20 LAB — SALICYLATE LEVEL: Salicylate Lvl: <4 mg/dL (ref 2.8–30.0)

## 2016-03-20 MED ORDER — ACETAMINOPHEN 325 MG PO TABS: 650.0000 mg | ORAL_TABLET | Freq: Four times a day (QID) | ORAL | Status: DC | PRN

## 2016-03-20 MED ORDER — MOMETASONE FURO-FORMOTEROL FUM 200-5 MCG/ACT IN AERO
2.0000 | INHALATION_SPRAY | Freq: Two times a day (BID) | RESPIRATORY_TRACT | Status: DC
  Filled 2016-03-20: qty 8.8

## 2016-03-20 MED ORDER — TRAZODONE HCL 100 MG PO TABS
100.0000 mg | ORAL_TABLET | Freq: Every day | ORAL | Status: DC
  Administered 2016-03-20 – 2016-03-23 (×4): 100 mg via ORAL
  Filled 2016-03-20 (×4): qty 1

## 2016-03-20 MED ORDER — NICOTINE 21 MG/24HR TD PT24
MEDICATED_PATCH | TRANSDERMAL | Status: AC
  Administered 2016-03-20: 21 mg via TRANSDERMAL
  Filled 2016-03-20: qty 1

## 2016-03-20 MED ORDER — QUETIAPINE FUMARATE 25 MG PO TABS
50.0000 mg | ORAL_TABLET | Freq: Every day | ORAL | Status: DC
  Administered 2016-03-20: 50 mg via ORAL
  Filled 2016-03-20: qty 2

## 2016-03-20 MED ORDER — CLONIDINE HCL 0.1 MG PO TABS
0.1000 mg | ORAL_TABLET | Freq: Once | ORAL | Status: AC
  Administered 2016-03-20: 0.1 mg via ORAL
  Filled 2016-03-20: qty 1

## 2016-03-20 MED ORDER — BUPRENORPHINE HCL 8 MG SL SUBL
8.0000 mg | SUBLINGUAL_TABLET | Freq: Every day | SUBLINGUAL | Status: DC
  Administered 2016-03-20 – 2016-03-21 (×2): 8 mg via SUBLINGUAL
  Filled 2016-03-20 (×2): qty 1

## 2016-03-20 MED ORDER — ALUM & MAG HYDROXIDE-SIMETH 200-200-20 MG/5ML PO SUSP: 30.0000 mL | ORAL | Status: DC | PRN

## 2016-03-20 MED ORDER — NICOTINE 21 MG/24HR TD PT24
21.0000 mg | MEDICATED_PATCH | Freq: Every day | TRANSDERMAL | Status: DC
  Administered 2016-03-21 – 2016-03-23 (×2): 21 mg via TRANSDERMAL
  Filled 2016-03-20 (×3): qty 1

## 2016-03-20 MED ORDER — MAGNESIUM HYDROXIDE 400 MG/5ML PO SUSP: 30.0000 mL | Freq: Every day | ORAL | Status: DC | PRN

## 2016-03-20 MED ORDER — NICOTINE 21 MG/24HR TD PT24
21.0000 mg | MEDICATED_PATCH | Freq: Every day | TRANSDERMAL | Status: DC
  Administered 2016-03-20: 21 mg via TRANSDERMAL

## 2016-03-20 MED ORDER — GABAPENTIN 400 MG PO CAPS
800.0000 mg | ORAL_CAPSULE | Freq: Every day | ORAL | Status: DC
  Administered 2016-03-20 – 2016-03-21 (×2): 800 mg via ORAL
  Filled 2016-03-20 (×2): qty 2

## 2016-03-20 NOTE — ED Notes (Signed)

## 2016-03-20 NOTE — ED Notes (Addendum)
Patient transferred to room #4 following medical clearance in ED.  Denies SI/HI, stating sister informed him hat he must report that in order to gain hospital admission.  He is desiring long term treatment following heroin detox.  Reports 2-4 gm daily usage.  Currently experiencing mild anxiety r/t withdrawal. States he has family support.  Support offered and call to EDP for medication for opiate withdrawal symptoms.

## 2016-03-20 NOTE — Consult Note (Signed)
Mr. Scott Brock will be admitted to psychiatry. Full note to follow.

## 2016-03-20 NOTE — BH Assessment (Signed)
Assessment Note  Scott Brock is an 35 y.o. male presenting to the ED for detox from heroin. Pt initially reported vague suicidal thoughts about driving off the road.  Pt was seen in the ED 3 days ago for the same but was told that this hospital does not provide detox.  Pt admitted that he only said he was suicidal because he knew he would be able to be admitted to this hospital for detox if he claimed he was suicidal.    Patient reports he found a program near [place] who would accept him but states he was told that he would have to go through detox first.  [facility] and has been turned down to other facilities due to his insurance.    Pt reports using heroin at 2200 today. He states he "snorts" heroin typically  uses  between 2-3 grams daily.  Diagnosis: Detox  Past Medical History:  Past Medical History  Diagnosis Date  . Status post dilation of esophageal narrowing   . Chronic back pain   . Renal disorder   . Asthma   . Acid reflux     Past Surgical History  Procedure Laterality Date  . Upper endoscopy w/ esophageal manometry    . Rhinoplasty      Family History: History reviewed. No pertinent family history.  Social History:  reports that he has been smoking Cigarettes.  He has been smoking about 0.50 packs per day. He has never used smokeless tobacco. He reports that he drinks alcohol. He reports that he uses illicit drugs (Marijuana).  Additional Social History:  Alcohol / Drug Use History of alcohol / drug use?: Yes Substance #1 Name of Substance 1: Heroin 1 - Age of First Use: 31 1 - Amount (size/oz): varies 1 - Frequency: varies 1 - Duration: varies 1 - Last Use / Amount: 03/19/2016  CIWA: CIWA-Ar BP: 136/85 mmHg Pulse Rate: 67 COWS:    Allergies: No Known Allergies  Home Medications:  (Not in a hospital admission)  OB/GYN Status:  No LMP for male patient.  General Assessment Data [facility]  Assessment: In system Is this a Tele or Face-to-Face Assessment?: Face-to-Face Is this an Initial Assessment or a Re-assessment for this encounter?: Initial Assessment Marital status: Single Maiden name: N/A Is patient pregnant?: No Pregnancy Status: No Living Arrangements: Parent Can pt return to current living arrangement?: Yes Admission Status: Involuntary Is patient capable of signing voluntary admission?: Yes Referral Source: Self/Family/Friend Insurance type: [employer]     Crisis Care Plan Living Arrangements: Parent Legal Guardian: Other: (self) Name of Psychiatrist: None reported Name of Therapist: None reported  Education Status Is patient currently in school?: No Current Grade: N/A Highest grade of school patient has completed: 12th Name of school: N/A Contact person: N/A  Risk to self with the past 6 months Suicidal Ideation: No Has patient been a risk to self within the past 6 months prior to admission? : No Suicidal Intent: No Has patient had any suicidal intent within the past 6 months prior to admission? : No Is patient at risk for suicide?: No Suicidal Plan?: No Has patient had any suicidal plan within the past 6 months prior to admission? : No Access to Means: No What has been your use of drugs/alcohol within the last 12 months?: Heroin yesterday Previous Attempts/Gestures: No How many times?: 0 Other Self Harm Risks: drug use Triggers for Past Attempts: None known  Intentional Self Injurious Behavior: None Family Suicide History: No Recent stressful life event(s): Legal Issues Persecutory voices/beliefs?: No Depression: Yes Depression Symptoms: Feeling angry/irritable, Feeling worthless/self pity Substance abuse history and/or treatment for substance abuse?: Yes Suicide prevention information given to non-admitted patients: Not applicable  Risk to Others within the past 6 months Homicidal Ideation: No Does patient have any lifetime risk of violence  toward others beyond the six months prior to admission? : No Thoughts of Harm to Others: No Current Homicidal Intent: No Current Homicidal Plan: No Access to Homicidal Means: No Identified Victim: None identified History of harm to others?: No Assessment of Violence: None Noted Violent Behavior Description: None identified Does patient have access to weapons?: No Criminal Charges Pending?: Yes Describe Pending Criminal Charges: Misdeameanor stalking Does patient have a court date: Yes Court Date: 04/01/16 Is patient on probation?: No  Psychosis Hallucinations: None noted Delusions: None noted  Mental Status Report Appearance/Hygiene: In scrubs Eye Contact: Good Motor Activity: Freedom of movement Speech: Logical/coherent, Argumentative Level of Consciousness: Alert Mood: Anxious, Irritable Affect: Anxious, Irritable Anxiety Level: Minimal Thought Processes: Coherent, Relevant Judgement: Unimpaired Orientation: Person, Place, Time, Situation Obsessive Compulsive Thoughts/Behaviors: None  Cognitive Functioning Concentration: Normal Memory: Recent Intact, Remote Intact IQ: Average Insight: Fair Impulse Control: Fair Appetite: Fair Weight Loss: 0 Weight Gain: 0 Sleep: No Change Vegetative Symptoms: None  ADLScreening [facility] Assessment Services) Patient's cognitive ability adequate to safely complete daily activities?: Yes Patient able to express need for assistance with ADLs?: Yes Independently performs ADLs?: Yes (appropriate for developmental age)  Prior Inpatient Therapy Prior Inpatient Therapy: No Prior Therapy Dates: N/A Prior Therapy Facilty/Provider(s): N/A Reason for Treatment: N/A  Prior Outpatient Therapy Prior Outpatient Therapy: No Prior Therapy Dates: N/A Prior Therapy Facilty/Provider(s): N/A Reason for Treatment: N/A Does patient have an ACCT team?: No Does patient have Intensive In-House Services?  : No Does patient have [facility] services? :  No Does patient have P4CC services?: No  ADL Screening (condition at time of admission) Patient's cognitive ability adequate to safely complete daily activities?: Yes Patient able to express need for assistance with ADLs?: Yes Independently performs ADLs?: Yes (appropriate for developmental age)       Abuse/Neglect Assessment (Assessment to be complete while patient is alone) Physical Abuse: Denies Verbal Abuse: Denies Sexual Abuse: Denies Exploitation of patient/patient's resources: Denies Self-Neglect: Denies Values / Beliefs Cultural Requests During Hospitalization: None Spiritual Requests During Hospitalization: None Consults Spiritual Care Consult Needed: No Social Work Consult Needed: No Merchant navy officer (For Healthcare) Does patient have an advance directive?: No Would patient like information on creating an advanced directive?: Yes English as a second language teacher given    Additional Information 1:1 In Past 12 Months?: No CIRT Risk: No Elopement Risk: No Does patient have medical clearance?: Yes     Disposition:  Disposition Initial Assessment Completed for this Encounter: Yes Disposition of Patient: Other dispositions Other disposition(s): Other (Comment) (Psych MD consult)  On Site Evaluation by:   Reviewed with Physician:    [place] 03/20/2016 2:55 AM

## 2016-03-20 NOTE — [facility] Suicide Risk Assessment (Signed)
[facility]   Nursing information obtained from:    Demographic factors:    Current Mental Status:    Loss Factors:    Historical Factors:    Risk Reduction Factors:     Total Time spent with patient: 1 hour Principal Problem: Major depressive disorder, single episode, severe without psychotic features (HCC) Diagnosis:   Patient Active Problem List   Diagnosis Date Noted  . 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 Opioid use disorder, severe, dependence (HCC) [F11.20] 03/20/2016  . Tobacco use disorder [F17.200] 03/20/2016   Subjective Data: Depression, substance use.  Continued Clinical Symptoms:    The "Alcohol Use Disorders Identification Test", Guidelines for Use in Primary Care, Second Edition.  World Science writerHealth Organization [facility](WHO). Score between 0-7:  no or low risk or alcohol related problems. Score between 8-15:  moderate risk of alcohol related problems. Score between 16-19:  high risk of alcohol related problems. Score 20 or above:  warrants further diagnostic evaluation for alcohol dependence and treatment.   CLINICAL FACTORS:   Severe Anxiety and/or Agitation Depression:   Comorbid alcohol abuse/dependence Impulsivity Alcohol/Substance Abuse/Dependencies   Musculoskeletal: Strength & Muscle Tone: within normal limits Gait & Station: normal Patient leans: N/A  Psychiatric Specialty Exam: Review of Systems  Gastrointestinal: Positive for nausea, vomiting, abdominal pain and diarrhea.  Psychiatric/Behavioral: Positive for depression and suicidal ideas.  All other systems reviewed and are negative.   There were no vitals taken for this visit.There is no weight on file to calculate BMI.  General Appearance: Disheveled  Eye Contact::  Absent  Speech:  Slow  Volume:  Decreased  Mood:  Anxious and Depressed  Affect:  Blunt  Thought Process:  Goal Directed  Orientation:   Full (Time, Place, and Person)  Thought Content:  WDL  Suicidal Thoughts:  Yes.  with intent/plan  Homicidal Thoughts:  No  Memory:  Immediate;   Fair Recent;   Fair Remote;   Fair  Judgement:  Poor  Insight:  Shallow  Psychomotor Activity:  Decreased  Concentration:  Fair  Recall:  [employer]  Fund of Knowledge:Fair  Language: Fair  Akathisia:  No  Handed:  Right  AIMS (if indicated):     Assets:  Communication Skills Desire for Improvement Financial Resources/Insurance Housing Physical Health Resilience Social Support  Sleep:     Cognition: WNL  ADL's:  Intact    COGNITIVE FEATURES THAT CONTRIBUTE TO RISK:  None    SUICIDE RISK:   Moderate:  Frequent suicidal ideation with limited intensity, and duration, some specificity in terms of plans, no associated intent, good self-control, limited dysphoria/symptomatology, some risk factors present, and identifiable protective factors, including available and accessible social support.  PLAN OF CARE: Hospital admission, medication management, and substance abuse counseling, discharge planning.  Mr. [name] is a 35 year old male with history of opiate dependence and numerous of depression admitted for suicidal ideation in the context of substance use.  1. Suicidal ideation. The patient is able to contract for safety.  2. Mood. We will start Seroquel for depression.  3. Opiate detox with offer Suboxone detox.  4. Smoking. Nicotine patch is available.    5. Asthma. He is on [place].  6. Substance abuse treatment. The patient desires residential treatment and has been looking into several faith-based programs.  7. Disposition. He will be discharged to home with his family. He will hopefully follow-up with residential treatment.    I certify that inpatient  services furnished can reasonably be expected to improve the patient's condition.   Kristine Linea, MD 03/20/2016, 3:21 PM

## 2016-03-20 NOTE — ED Notes (Signed)
Pt last used heroin today.

## 2016-03-20 NOTE — ED Provider Notes (Signed)
[facility] Emergency Department Provider Note  ____________________________________________  Time seen: Approximately 5:02 AM  I have reviewed the triage vital signs and the nursing notes.   HISTORY  Chief Complaint Suicidal    HPI Scott Brock is a 35 y.o. male with a history of heroin abuse who presents by private vehicle requesting heroin detox.  He was seen recently for the same, but today he is saying that he wants to kill himself by wrecking his car.  He is having some social issues with his family as well.  After he was seen last time he was referred [facility], but he has not followed up with them.  He did try going to [facility] but was told that facility that they also do not do inpatient heroin detox.  He describes his symptoms as gradual in onset, severe, nothing makes it better and nothing makes it worse.  He last used heroin today.  He denies fever/chills, chest pain, shortness of breath, abdominal pain, dysuria.   Past Medical History  Diagnosis Date  . Status post dilation of esophageal narrowing   . Chronic back pain   . Renal disorder   . Asthma   . Acid reflux     There are no active problems to display for this patient.   Past Surgical History  Procedure Laterality Date  . Upper endoscopy w/ esophageal manometry    . Rhinoplasty      Current Outpatient Rx  Name  Route  Sig  Dispense  Refill  . buprenorphine-naloxone (SUBOXONE) 2-0.5 MG SUBL SL tablet   Sublingual   Place 1 tablet under the tongue daily.         . clindamycin (CLEOCIN) 300 MG capsule   Oral   Take 300 mg by mouth 3 (three) times daily.         . Fluticasone-Salmeterol (ADVAIR) 250-50 MCG/DOSE AEPB   Inhalation   Inhale 1 puff into the lungs every 12 (twelve) hours as needed.          . gabapentin (NEURONTIN) 800 MG tablet   Oral   Take 800 mg by mouth at bedtime.         Marland Kitchen. ibuprofen (ADVIL,MOTRIN) 800 MG tablet   Oral   Take 800 mg by  mouth every 8 (eight) hours as needed.         Marland Kitchen. oxyCODONE-acetaminophen (PERCOCET) 10-325 MG per tablet   Oral   Take 1 tablet by mouth every 4 (four) hours as needed for pain.   6 tablet   0   . traMADol (ULTRAM) 50 MG tablet   Oral   Take by mouth every 6 (six) hours as needed.           Allergies Review of patient's allergies indicates no known allergies.  History reviewed. No pertinent family history.  Social History Social History  Substance Use Topics  . Smoking status: Current Every Day Smoker -- 0.50 packs/day    Types: Cigarettes  . Smokeless tobacco: Never Used  . Alcohol Use: Yes     Comment: social    Review of Systems Constitutional: No fever/chills Eyes: No visual changes. ENT: No sore throat. Cardiovascular: Denies chest pain. Respiratory: Denies shortness of breath. Gastrointestinal: No abdominal pain.  No nausea, no vomiting.  No diarrhea.  No constipation. Genitourinary: Negative for dysuria. Musculoskeletal: Negative for back pain. Skin: Negative for rash. Neurological: Negative for headaches, focal weakness or numbness. Psych:  Cherlynn PerchesEndorses  substance abuse and SI  10-point ROS otherwise negative.  ____________________________________________   PHYSICAL EXAM:  VITAL SIGNS: ED Triage Vitals  Enc Vitals Group     BP 03/19/16 2355 136/85 mmHg     Pulse Rate 03/19/16 2355 67     Resp 03/19/16 2355 18     Temp 03/19/16 2355 98.2 F (36.8 C)     Temp Source 03/19/16 2355 Oral     SpO2 03/19/16 2355 99 %     Weight 03/19/16 2355 180 lb (81.647 kg)     Height 03/19/16 2355  (1.854 m)     Head Cir --      Peak Flow --      Pain Score 03/20/16 0105 0     Pain Loc --      Pain Edu? --      Excl. in GC? --     Constitutional: Alert and oriented. Well appearing and in no acute distress. Eyes: Conjunctivae are normal. PERRL. EOMI. Head: Atraumatic. Nose: No congestion/rhinnorhea. Mouth/Throat: Mucous membranes are moist.  Oropharynx  non-erythematous. Neck: No stridor.  No meningeal signs.   Cardiovascular: Normal rate, regular rhythm. Good peripheral circulation. Grossly normal heart sounds.   Respiratory: Normal respiratory effort.  No retractions. Lungs CTAB. Gastrointestinal: Soft and nontender. No distention.  Musculoskeletal: No lower extremity tenderness nor edema. No gross deformities of extremities. Neurologic:  Normal speech and language. No gross focal neurologic deficits are appreciated.  Skin:  Skin is warm, dry and intact. No rash noted. Psychiatric: Mood and affect are normal. Speech and behavior are normal.  Admits he does NOT have SI after a discussion (see Course below)  ____________________________________________   LABS (all labs ordered are listed, but only abnormal results are displayed)  Labs Reviewed  COMPREHENSIVE METABOLIC PANEL - Abnormal; Notable for the following:    Glucose, Bld 107 (*)    Anion gap 4 (*)    All other components within normal limits  CBC - Abnormal; Notable for the following:    WBC 13.3 (*)    All other components within normal limits  URINE DRUG SCREEN, QUALITATIVE ([facility] ONLY) - Abnormal; Notable for the following:    Opiate, Ur Screen POSITIVE (*)    All other components within normal limits  ACETAMINOPHEN LEVEL - Abnormal; Notable for the following:    Acetaminophen (Tylenol), Serum <10 (*)    All other components within normal limits  ETHANOL  SALICYLATE LEVEL   ____________________________________________  EKG  None ____________________________________________  RADIOLOGY   No results found.  ____________________________________________   PROCEDURES  Procedure(s) performed: None  Critical Care performed: No ____________________________________________   INITIAL IMPRESSION / ASSESSMENT AND PLAN / ED COURSE  Pertinent labs & imaging results that were available during my care of the patient were reviewed by me and considered in my medical  decision making (see chart for details).  Initially the patient was complaining of suicidal ideation and I placed him under involuntary commitment so that he would not leave while I was dealing with a critical patient, but after Roxana with [facility] spoke with him, he admitted that his sister told him that if he endorsed suicidal ideation he would be admitted for treatment.  I grilled him about this and about his current symptoms and he adamantly denies any suicidal ideation, he said he is just very much in need of help with detox and did not know how to get it.  I pointed out that he has not followed up  with [facility], [facility], or [facility] [place] as was recommended to him.  He does have a plan for another facility but he is supposed to go to detox first before he goes to the facility for longer term treatment.  Given the change in status and plan and the fact that I do believe him that he has no intention of harming himself, I revoked the IVC.  Roxana found out that either [facility] or [facility] may have a room for him in the morning for heroin detox.  As a result, we will keep him overnight until the room situation has been determined, but I made it very clear to the patient that there still may not be a room and he may be discharged for outpatient follow-up as planned.  ____________________________________________  FINAL CLINICAL IMPRESSION(S) / ED DIAGNOSES  Final diagnoses:  Heroin addiction (HCC)      NEW MEDICATIONS STARTED DURING THIS VISIT:  New Prescriptions   No medications on file      Note:  This document was prepared using Dragon voice recognition software and may include unintentional dictation errors.   Loleta Rose, MD 03/20/16 [phone]

## 2016-03-20 NOTE — Progress Notes (Signed)
Patient ID: Scott Brock, male   DOB: Sep 12, 1981, 35 y.o.   MRN: [id number]  [facility] ED seeking detox from heroin, states that he sustained a back injury while in the [employer] and was prescribed opiate pain medication, pt was then switched to Soboxone per his MD and when patient was going through divorce was introduced to heroin, pt has been using daily heroin x1 year, 2 to 3 grams per day, pt has a 35 year old daughter, currently staying at his mother's house and would like to go to a long term faith based rehab program through his mother's church after discharge, pt c/o stuffy nose and "creepy crawly feeling" on his skin, denies SI/HI/AVH, denies depression, c/o decreased appetite, sleeping ok, skin/contraband search done, skin intact, no contraband found, oriented pt to unit and rules, pleasant and cooperative throughout the admission process.

## 2016-03-20 NOTE — H&P (Signed)
Psychiatric Admission Assessment Adult  Patient Identification: Scott Brock MRN:  [id number] Date of Evaluation:  03/20/2016 Chief Complaint:  depression Principal Diagnosis: Major depressive disorder, single episode, severe without psychotic features ([place]) Diagnosis:   Patient Active Problem List   Diagnosis Date Noted  . Major depressive disorder, single episode, severe without psychotic features ([place]) [F32.2] 03/20/2016  . Opioid use disorder, severe, dependence ([place]) [F11.20] 03/20/2016  . Tobacco use disorder [F17.200] 03/20/2016   History of Present Illness:  Identifying data. Mr. Twedt is a 35 year old male with a history of opiate addiction and new onset depression.   Chief complaint. "I need treatment."  History of present illness. Information was obtained from the patient and the chart. The patient has been snorting heroine 2-3 grams a day. He has been trying to obtain treatment and had been to Suboxone clinic but did not find it helpful. 2 months ago he went to [facility] for rehabilitation but continued on Suboxone which he now thinks was a mistake. He has not been able to maintain any sobriety. He is almost homeless as his mother would not tolerate his addiction and a longer. He started looking into [facility] rehabilitation [facility] as he that they will not allow any medication. He's been trying to get detox with not much success. In the process his mood has been going down. He reports poor sleep, decreased appetite, anhedonia, feeling of guilt and hopelessness worthlessness, poor energy and concentration, social isolation. He has been so frustrated with his failure to get treatment that now he reports suicidal thoughts with a plan to overdose. He does have [facility] privileges but no beds are available. In addition he does not think highly of the [place] system. He denies psychotic symptoms or excessive anxiety. He denies symptoms suggestive of bipolar mania. He denies alcohol or  other illicit substance use.   Past psychiatric history. Has never been hospitalized. There were no suicide attempts. He did attended Suboxone clinic and completed rehabilitation and [place].   Family psychiatric history. Nonreported.  Social history. He is retired from [employer]. He lives with his mother that may not longer be possible.  Total Time spent with patient: 1 hour  Past Psychiatric History: opiate addiction.  the patient at risk to self? Yes.    Has the patient been a risk to self in the past 6 months? Yes.    Has the patient been a risk to self within the distant past? Yes.    Is the patient a risk to others? No.  Has the patient been a risk to others in the past 6 months? No.  Has the patient been a risk to others within the distant past? No.   Prior Inpatient Therapy:   Prior Outpatient Therapy:    Alcohol Screening:   Substance Abuse History in the last 12 months:  Yes.   Consequences of Substance Abuse: Negative Previous Psychotropic Medications: No  Psychological Evaluations: No  Past Medical History:  Past Medical History  Diagnosis Date  . Status post dilation of esophageal narrowing   . Chronic back pain   . Renal disorder   . Asthma   . Acid reflux     Past Surgical History  Procedure Laterality Date  . Upper endoscopy w/ esophageal manometry    . Rhinoplasty     Family History: No family history on file. Family Psychiatric  History: none. Tobacco Screening: '@FLOW'$ ([phone])::1)@ Social History:  History  Alcohol Use  . Yes    Comment:  social     History  Drug Use  . Yes  . Special: Marijuana    Comment: heroin    Additional Social History:                           Allergies:  No Known Allergies Lab Results:  Results for orders placed or performed during the hospital encounter of 03/20/16 (from the past 48 hour(s))  Acetaminophen level     Status: Abnormal   Collection Time: 03/20/16 12:37 AM  Result Value Ref Range    Acetaminophen (Tylenol), Serum <10 (L) 10 - 30 ug/mL    Comment:        THERAPEUTIC CONCENTRATIONS VARY SIGNIFICANTLY. A RANGE OF 10-30 ug/mL MAY BE AN EFFECTIVE CONCENTRATION FOR MANY PATIENTS. HOWEVER, SOME ARE BEST TREATED AT CONCENTRATIONS OUTSIDE THIS RANGE. ACETAMINOPHEN CONCENTRATIONS >150 ug/mL AT 4 HOURS AFTER INGESTION AND >50 ug/mL AT 12 HOURS AFTER INGESTION ARE OFTEN ASSOCIATED WITH TOXIC REACTIONS.   Salicylate level     Status: None   Collection Time: 03/20/16 12:37 AM  Result Value Ref Range   Salicylate Lvl <3.7 2.8 - 30.0 mg/dL  Comprehensive metabolic panel     Status: Abnormal   Collection Time: 03/20/16  1:37 AM  Result Value Ref Range   Sodium 135 135 - 145 mmol/L   Potassium 4.1 3.5 - 5.1 mmol/L   Chloride 102 101 - 111 mmol/L   CO2 29 22 - 32 mmol/L   Glucose, Bld 107 (H) 65 - 99 mg/dL   BUN 15 6 - 20 mg/dL   Creatinine, Ser 0.90 0.61 - 1.24 mg/dL   Calcium 9.2 8.9 - 10.3 mg/dL   Total Protein 7.2 6.5 - 8.1 g/dL   Albumin 4.5 3.5 - 5.0 g/dL   AST 22 15 - 41 U/L   ALT 30 17 - 63 U/L   Alkaline Phosphatase 82 38 - 126 U/L   Total Bilirubin 0.4 0.3 - 1.2 mg/dL   GFR calc non Af Amer >60 >60 mL/min   GFR calc Af Amer >60 >60 mL/min    Comment: (NOTE) The eGFR has been calculated using the CKD EPI equation. This calculation has not been validated in all clinical situations. eGFR's persistently <60 mL/min signify possible Chronic Kidney Disease.    Anion gap 4 (L) 5 - 15  Ethanol (ETOH)     Status: None   Collection Time: 03/20/16  1:37 AM  Result Value Ref Range   Alcohol, Ethyl (B) <5 <5 mg/dL    Comment:        LOWEST DETECTABLE LIMIT FOR SERUM ALCOHOL IS 5 mg/dL FOR MEDICAL PURPOSES ONLY   CBC     Status: Abnormal   Collection Time: 03/20/16  1:37 AM  Result Value Ref Range   WBC 13.3 (H) 3.8 - 10.6 K/uL   RBC 5.09 4.40 - 5.90 MIL/uL   Hemoglobin 16.4 13.0 - 18.0 g/dL   HCT 47.4 40.0 - 52.0 %   MCV 93.0 80.0 - 100.0 fL   MCH 32.2  26.0 - 34.0 pg   MCHC 34.7 32.0 - 36.0 g/dL   RDW 13.0 11.5 - 14.5 %   Platelets 216 150 - 440 K/uL  Urine Drug Screen, Qualitative ([facility] only)     Status: Abnormal   Collection Time: 03/20/16  1:56 AM  Result Value Ref Range   Tricyclic, Ur Screen NONE DETECTED NONE DETECTED   Amphetamines, Ur Screen NONE DETECTED NONE  DETECTED   MDMA (Ecstasy)Ur Screen NONE DETECTED NONE DETECTED   Cocaine Metabolite,Ur [place] NONE DETECTED NONE DETECTED   Opiate, Ur Screen POSITIVE (A) NONE DETECTED   Phencyclidine (PCP) Ur S NONE DETECTED NONE DETECTED   Cannabinoid 50 Ng, Ur [place] NONE DETECTED NONE DETECTED   Barbiturates, Ur Screen NONE DETECTED NONE DETECTED   Benzodiazepine, Ur Scrn NONE DETECTED NONE DETECTED   Methadone Scn, Ur NONE DETECTED NONE DETECTED    Comment: (NOTE) 151  Tricyclics, urine               Cutoff 1000 ng/mL 200  Amphetamines, urine             Cutoff 1000 ng/mL 300  MDMA (Ecstasy), urine           Cutoff 500 ng/mL 400  Cocaine Metabolite, urine       Cutoff 300 ng/mL 500  Opiate, urine                   Cutoff 300 ng/mL 600  Phencyclidine (PCP), urine      Cutoff 25 ng/mL 700  Cannabinoid, urine              Cutoff 50 ng/mL 800  Barbiturates, urine             Cutoff 200 ng/mL 900  Benzodiazepine, urine           Cutoff 200 ng/mL 1000 Methadone, urine                Cutoff 300 ng/mL 1100 1200 The urine drug screen provides only a preliminary, unconfirmed 1300 analytical test result and should not be used for non-medical 1400 purposes. Clinical consideration and professional judgment should 1500 be applied to any positive drug screen result due to possible 1600 interfering substances. A more specific alternate chemical method 1700 must be used in order to obtain a confirmed analytical result.  1800 Gas chromato graphy / mass spectrometry (GC/MS) is the preferred 1900 confirmatory method.     Blood Alcohol level:  Lab Results  Component Value Date   ETH <5 03/20/2016    ETH 140* 76/16/0737    Metabolic Disorder Labs:  No results found for: HGBA1C, MPG No results found for: PROLACTIN No results found for: CHOL, TRIG, HDL, CHOLHDL, VLDL, LDLCALC  Current Medications: Current Facility-Administered Medications  Medication Dose Route Frequency Provider Last Rate Last Dose  . acetaminophen (TYLENOL) tablet 650 mg  650 mg Oral Q6H PRN Jolanta B Pucilowska, MD      . alum & mag hydroxide-simeth (MAALOX/MYLANTA) 200-200-20 MG/5ML suspension 30 mL  30 mL Oral Q4H PRN Jolanta B Pucilowska, MD      . buprenorphine (SUBUTEX) sublingual tablet 8 mg  8 mg Sublingual Daily Jolanta B Pucilowska, MD      . gabapentin (NEURONTIN) capsule 800 mg  800 mg Oral QHS Jolanta B Pucilowska, MD      . magnesium hydroxide (MILK OF MAGNESIA) suspension 30 mL  30 mL Oral Daily PRN Jolanta B Pucilowska, MD      . mometasone-formoterol (DULERA) 200-5 MCG/ACT inhaler 2 puff  2 puff Inhalation BID Clovis Fredrickson, MD      . Derrill Memo ON 03/21/2016] nicotine (NICODERM CQ - dosed in mg/24 hours) patch 21 mg  21 mg Transdermal Q0600 Jolanta B Pucilowska, MD      . traZODone (DESYREL) tablet 100 mg  100 mg Oral QHS Clovis Fredrickson, MD  PTA Medications: Prescriptions prior to admission  Medication Sig Dispense Refill Last Dose  . clindamycin (CLEOCIN) 300 MG capsule Take 300 mg by mouth 3 (three) times daily.     . Fluticasone-Salmeterol (ADVAIR) 250-50 MCG/DOSE AEPB Inhale 1 puff into the lungs every 12 (twelve) hours as needed.    Taking  . gabapentin (NEURONTIN) 800 MG tablet Take 800 mg by mouth at bedtime.   Taking  . ibuprofen (ADVIL,MOTRIN) 800 MG tablet Take 800 mg by mouth every 8 (eight) hours as needed.   02/05/2015 at Unknown time    Musculoskeletal: Strength & Muscle Tone: within normal limits Gait & Station: normal Patient leans: N/A  Psychiatric Specialty Exam: Physical Exam  Nursing note and vitals reviewed.   Review of Systems  Gastrointestinal: Positive  for nausea, vomiting, abdominal pain and diarrhea.  Psychiatric/Behavioral: Positive for depression, suicidal ideas and substance abuse.  All other systems reviewed and are negative.   There were no vitals taken for this visit.There is no weight on file to calculate BMI.  See SRA.                                                  Sleep:        Treatment Plan Summary: Daily contact with patient to assess and evaluate symptoms and progress in treatment and Medication management   Mr. Gilbo is a 35 year old male with history of opiate dependence and numerous of depression admitted for suicidal ideation in the context of substance use.  1. Suicidal ideation. The patient is able to contract for safety.  2. Mood. We will start Seroquel for depression.  3. Opiate detox with offer Suboxone detox.  4. Smoking. Nicotine patch is available.    5. Asthma. He is on [place].  6. Substance abuse treatment. The patient desires residential treatment and has been looking into several faith-based programs.  7. Disposition. He will be discharged to home with his family. He will hopefully follow-up with residential treatment.   Observation Level/Precautions:  15 minute checks  Laboratory:  CBC Chemistry Profile UDS UA  Psychotherapy:    Medications:    Consultations:    Discharge Concerns:    Estimated LOS:  Other:     I certify that inpatient services furnished can reasonably be expected to improve the patient's condition.    Orson Slick, MD 4/8/20173:26 PM

## 2016-03-20 NOTE — BH Assessment (Signed)
Writer called and and [facility] [phone] ext [phone]), he confirmed they were on [facility] and Substance Abuse beds.

## 2016-03-20 NOTE — Tx Team (Signed)
Initial Interdisciplinary Treatment Plan   PATIENT STRESSORS: Substance abuse   PATIENT STRENGTHS: Ability for insight Average or above average intelligence General fund of knowledge Motivation for treatment/growth Supportive family/friends   PROBLEM LIST: Problem List/Patient Goals Date to be addressed Date deferred Reason deferred Estimated date of resolution  Substance abuse                                                       DISCHARGE CRITERIA:  Verbal commitment to aftercare and medication compliance Withdrawal symptoms are absent or subacute and managed without 24-hour nursing intervention  PRELIMINARY DISCHARGE PLAN: Faith based Longterm Rehab Program  PATIENT/FAMIILY INVOLVEMENT: This treatment plan has been presented to and reviewed with the patient, Roseanna RainbowMark J Leath.  The patient and family have been given the opportunity to ask questions and make suggestions.  Lauris Poagndrea B Lynnetta Tom 03/20/2016, 3:33 PM

## 2016-03-21 MED ORDER — LOPERAMIDE HCL 2 MG PO CAPS: 4.0000 mg | ORAL_CAPSULE | ORAL | Status: DC | PRN

## 2016-03-21 MED ORDER — CYCLOBENZAPRINE HCL 10 MG PO TABS
10.0000 mg | ORAL_TABLET | Freq: Three times a day (TID) | ORAL | Status: DC | PRN
  Administered 2016-03-21 – 2016-03-23 (×5): 10 mg via ORAL
  Filled 2016-03-21 (×6): qty 1

## 2016-03-21 MED ORDER — QUETIAPINE FUMARATE 100 MG PO TABS
100.0000 mg | ORAL_TABLET | Freq: Every day | ORAL | Status: DC
  Administered 2016-03-21: 100 mg via ORAL
  Filled 2016-03-21: qty 1

## 2016-03-21 MED ORDER — CLONIDINE HCL 0.1 MG PO TABS
0.1000 mg | ORAL_TABLET | Freq: Three times a day (TID) | ORAL | Status: DC
  Administered 2016-03-21 – 2016-03-24 (×7): 0.1 mg via ORAL
  Filled 2016-03-21 (×9): qty 1

## 2016-03-21 MED ORDER — PROMETHAZINE HCL 25 MG PO TABS: 25.0000 mg | ORAL_TABLET | Freq: Four times a day (QID) | ORAL | Status: DC | PRN

## 2016-03-21 MED ORDER — IBUPROFEN 600 MG PO TABS
600.0000 mg | ORAL_TABLET | Freq: Four times a day (QID) | ORAL | Status: DC | PRN
  Administered 2016-03-21: 600 mg via ORAL
  Filled 2016-03-21: qty 1

## 2016-03-21 NOTE — [facility] Group Notes (Signed)
[facility] Group Notes:  (Nursing/MHT/Case Management/Adjunct)  Date:  03/21/2016  Time:  9:22 AM  Type of Therapy:  Goal setting   Participation Level:  Did Not Attend  Zayneb Baucum C Daryon Remmert 03/21/2016, 9:22 AM 

## 2016-03-21 NOTE — Progress Notes (Signed)
Pt has been pleasant and some what cooperative. Pt refused his am dose of buprenorphine 8mg s and dulera. Pt's mood and affect has been depressed. Pt states he wants to detox off of buprenorphine. Pt denies SI and A/V hallucinations. Pt has been seclusive to his room. Pt has not attended  Any unit activities.

## 2016-03-21 NOTE — Progress Notes (Signed)
[facility] MD Progress Note  03/21/2016 6:45 AM Scott Brock  MRN:  [id number]  Subjective:  Scott Brock refuses Suboxone today. And wants to be detoxed with "traditional" methods. We will start clonidine, ibuprofen, Phenergan, loperamide, and Flexeril. I will increase Seroquel to 100 mg. He is very worried about using Suboxone. The [facility] a [facility] for [facility] to call on Monday to discuss admission to the long term program. He is pretty much secluded to his room and does not interact with peers or staff.   Principal Problem: Major depressive disorder, single episode, severe without psychotic features ([place]) Diagnosis:   Patient Active Problem List   Diagnosis Date Noted  . Major depressive disorder, single episode, severe without psychotic features ([place]) [F32.2] 03/20/2016  . Opioid use disorder, severe, dependence ([place]) [F11.20] 03/20/2016  . Tobacco use disorder [F17.200] 03/20/2016   Total Time spent with patient: 20 minutes  Past Psychiatric History: Depression, substance use.  Past Medical History:  Past Medical History  Diagnosis Date  . Status post dilation of esophageal narrowing   . Chronic back pain   . Renal disorder   . Asthma   . Acid reflux     Past Surgical History  Procedure Laterality Date  . Upper endoscopy w/ esophageal manometry    . Rhinoplasty     Family History: History reviewed. No pertinent family history. Family Psychiatric  History: See H&P. Social History:  History  Alcohol Use  . Yes    Comment: social     History  Drug Use  . Yes  . Special: Marijuana    Comment: heroin    Social History   Social History  . Marital Status: Single    Spouse Name: N/A  . Number of Children: N/A  . Years of Education: N/A   Social History Main Topics  . Smoking status: Current Every Day Smoker -- 0.50 packs/day    Types: Cigarettes  . Smokeless tobacco: Never Used  . Alcohol Use: Yes     Comment: social  . Drug Use: Yes    Special:  Marijuana     Comment: heroin  . Sexual Activity: Yes    Birth Control/ Protection: None   Other Topics Concern  . None   Social History Narrative   Additional Social History:                         Sleep: Fair  Appetite:  Fair  Current Medications: Current Facility-Administered Medications  Medication Dose Route Frequency Provider Last Rate Last Dose  . acetaminophen (TYLENOL) tablet 650 mg  650 mg Oral Q6H PRN Candido Flott B Cyanne Delmar, MD      . alum & mag hydroxide-simeth (MAALOX/MYLANTA) 200-200-20 MG/5ML suspension 30 mL  30 mL Oral Q4H PRN Jun Osment B Lakeita Panther, MD      . buprenorphine (SUBUTEX) sublingual tablet 8 mg  8 mg Sublingual Daily Everest Hacking B May Manrique, MD   8 mg at 03/20/16 1613  . gabapentin (NEURONTIN) capsule 800 mg  800 mg Oral QHS Lorenia Hoston B Salisha Bardsley, MD   800 mg at 03/20/16 2132  . magnesium hydroxide (MILK OF MAGNESIA) suspension 30 mL  30 mL Oral Daily PRN Aribelle Mccosh B Neshawn Aird, MD      . mometasone-formoterol (DULERA) 200-5 MCG/ACT inhaler 2 puff  2 puff Inhalation BID Clovis Fredrickson, MD   2 puff at 03/20/16 2119  . nicotine (NICODERM CQ - dosed in mg/24 hours) patch 21 mg  21 mg Transdermal Q0600 Alyiah Ulloa B Rhyli Depaula, MD      . QUEtiapine (SEROQUEL) tablet 50 mg  50 mg Oral QHS Clovis Fredrickson, MD   50 mg at 03/20/16 2132  . traZODone (DESYREL) tablet 100 mg  100 mg Oral QHS Clovis Fredrickson, MD   100 mg at 03/20/16 2133    Lab Results:  Results for orders placed or performed during the hospital encounter of 03/20/16 (from the past 48 hour(s))  Acetaminophen level     Status: Abnormal   Collection Time: 03/20/16 12:37 AM  Result Value Ref Range   Acetaminophen (Tylenol), Serum <10 (L) 10 - 30 ug/mL    Comment:        THERAPEUTIC CONCENTRATIONS VARY SIGNIFICANTLY. A RANGE OF 10-30 ug/mL MAY BE AN EFFECTIVE CONCENTRATION FOR MANY PATIENTS. HOWEVER, SOME ARE BEST TREATED AT CONCENTRATIONS OUTSIDE THIS RANGE. ACETAMINOPHEN  CONCENTRATIONS >150 ug/mL AT 4 HOURS AFTER INGESTION AND >50 ug/mL AT 12 HOURS AFTER INGESTION ARE OFTEN ASSOCIATED WITH TOXIC REACTIONS.   Salicylate level     Status: None   Collection Time: 03/20/16 12:37 AM  Result Value Ref Range   Salicylate Lvl <1.1 2.8 - 30.0 mg/dL  Comprehensive metabolic panel     Status: Abnormal   Collection Time: 03/20/16  1:37 AM  Result Value Ref Range   Sodium 135 135 - 145 mmol/L   Potassium 4.1 3.5 - 5.1 mmol/L   Chloride 102 101 - 111 mmol/L   CO2 29 22 - 32 mmol/L   Glucose, Bld 107 (H) 65 - 99 mg/dL   BUN 15 6 - 20 mg/dL   Creatinine, Ser 0.90 0.61 - 1.24 mg/dL   Calcium 9.2 8.9 - 10.3 mg/dL   Total Protein 7.2 6.5 - 8.1 g/dL   Albumin 4.5 3.5 - 5.0 g/dL   AST 22 15 - 41 U/L   ALT 30 17 - 63 U/L   Alkaline Phosphatase 82 38 - 126 U/L   Total Bilirubin 0.4 0.3 - 1.2 mg/dL   GFR calc non Af Amer >60 >60 mL/min   GFR calc Af Amer >60 >60 mL/min    Comment: (NOTE) The eGFR has been calculated using the CKD EPI equation. This calculation has not been validated in all clinical situations. eGFR's persistently <60 mL/min signify possible Chronic Kidney Disease.    Anion gap 4 (L) 5 - 15  Ethanol (ETOH)     Status: None   Collection Time: 03/20/16  1:37 AM  Result Value Ref Range   Alcohol, Ethyl (B) <5 <5 mg/dL    Comment:        LOWEST DETECTABLE LIMIT FOR SERUM ALCOHOL IS 5 mg/dL FOR MEDICAL PURPOSES ONLY   CBC     Status: Abnormal   Collection Time: 03/20/16  1:37 AM  Result Value Ref Range   WBC 13.3 (H) 3.8 - 10.6 K/uL   RBC 5.09 4.40 - 5.90 MIL/uL   Hemoglobin 16.4 13.0 - 18.0 g/dL   HCT 47.4 40.0 - 52.0 %   MCV 93.0 80.0 - 100.0 fL   MCH 32.2 26.0 - 34.0 pg   MCHC 34.7 32.0 - 36.0 g/dL   RDW 13.0 11.5 - 14.5 %   Platelets 216 150 - 440 K/uL  Urine Drug Screen, Qualitative ([facility] only)     Status: Abnormal   Collection Time: 03/20/16  1:56 AM  Result Value Ref Range   Tricyclic, Ur Screen NONE DETECTED NONE DETECTED    Amphetamines, Ur Screen  NONE DETECTED NONE DETECTED   MDMA (Ecstasy)Ur Screen NONE DETECTED NONE DETECTED   Cocaine Metabolite,Ur [place] NONE DETECTED NONE DETECTED   Opiate, Ur Screen POSITIVE (A) NONE DETECTED   Phencyclidine (PCP) Ur S NONE DETECTED NONE DETECTED   Cannabinoid 50 Ng, Ur [place] NONE DETECTED NONE DETECTED   Barbiturates, Ur Screen NONE DETECTED NONE DETECTED   Benzodiazepine, Ur Scrn NONE DETECTED NONE DETECTED   Methadone Scn, Ur NONE DETECTED NONE DETECTED    Comment: (NOTE) 301  Tricyclics, urine               Cutoff 1000 ng/mL 200  Amphetamines, urine             Cutoff 1000 ng/mL 300  MDMA (Ecstasy), urine           Cutoff 500 ng/mL 400  Cocaine Metabolite, urine       Cutoff 300 ng/mL 500  Opiate, urine                   Cutoff 300 ng/mL 600  Phencyclidine (PCP), urine      Cutoff 25 ng/mL 700  Cannabinoid, urine              Cutoff 50 ng/mL 800  Barbiturates, urine             Cutoff 200 ng/mL 900  Benzodiazepine, urine           Cutoff 200 ng/mL 1000 Methadone, urine                Cutoff 300 ng/mL 1100 1200 The urine drug screen provides only a preliminary, unconfirmed 1300 analytical test result and should not be used for non-medical 1400 purposes. Clinical consideration and professional judgment should 1500 be applied to any positive drug screen result due to possible 1600 interfering substances. A more specific alternate chemical method 1700 must be used in order to obtain a confirmed analytical result.  1800 Gas chromato graphy / mass spectrometry (GC/MS) is the preferred 1900 confirmatory method.     Blood Alcohol level:  Lab Results  Component Value Date   ETH <5 03/20/2016   ETH 140* 05/26/2013    Physical Findings: AIMS:  , ,  ,  ,    CIWA:    COWS:  COWS Total Score: 3  Musculoskeletal: Strength & Muscle Tone: within normal limits Gait & Station: normal Patient leans: N/A  Psychiatric Specialty Exam: Review of Systems  Gastrointestinal:  Positive for nausea, abdominal pain and diarrhea.  Psychiatric/Behavioral: Positive for depression, suicidal ideas and substance abuse.  All other systems reviewed and are negative.   Blood pressure 118/68, pulse 79, temperature 97.8 F (36.6 C), temperature source Tympanic, resp. rate 18, height '6\' 1"'$  (1.854 m), weight 80.287 kg (177 lb), SpO2 99 %.Body mass index is 23.36 kg/(m^2).  General Appearance: Disheveled  Eye Contact::  Minimal  Speech:  Clear and Coherent  Volume:  Normal  Mood:  Anxious and Depressed  Affect:  Blunt  Thought Process:  Goal Directed  Orientation:  Full (Time, Place, and Person)  Thought Content:  WDL  Suicidal Thoughts:  Yes.  with intent/plan  Homicidal Thoughts:  No  Memory:  Immediate;   Fair Recent;   Fair Remote;   Fair  Judgement:  Poor  Insight:  Lacking  Psychomotor Activity:  Decreased  Concentration:  Fair  Recall:  [place]  Language: Fair  Akathisia:  No  Handed:  Right  AIMS (  if indicated):     Assets:  Communication Skills Desire for Improvement Financial Resources/Insurance Housing Physical Health Resilience Social Support  ADL's:  Intact  Cognition: WNL  Sleep:      Treatment Plan Summary: Daily contact with patient to assess and evaluate symptoms and progress in treatment and Medication management   Mr. Theil is a 35 year old male with history of opiate dependence and numerous of depression admitted for suicidal ideation in the context of substance use.  1. Suicidal ideation. The patient is able to contract for safety.  2. Mood. We will start Seroquel for depression and increase it to 100 mg tonight.  3. Opiate detox. We discontinued Suboxone and started symptomatic treatment for opioid withdrawal.   4. Smoking. Nicotine patch is available.   5. Asthma. He is on [place].  6. Substance abuse treatment. The patient desires residential treatment and has been looking into several faith-based  programs.  7. Disposition. He will be discharged to home with his family. He will hopefully follow-up with residential treatment.   Orson Slick, MD 03/21/2016, 6:45 AM

## 2016-03-21 NOTE — [facility] Group Notes (Signed)
[facility] LCSW Group Therapy  03/21/2016 4:24 PM   Type of Therapy:  Group Therapy  Participation Level:  Did Not Attend  Modes of Intervention:  Discussion, Education, Socialization and Support  Summary of Progress/Problems:. Todays topic: Grudges  Patients will be encouraged to discuss their thoughts, feelings, and behaviors as to why one holds on to grudges and reasons why people have grudges. Patients will process the impact of grudges on their daily lives and identify thoughts and feelings related to holding grudges. Patients will identify feelings and thoughts related to what life would look like without grudges.   Scott Brock L Scott Brock MSW, LCSWA  03/21/2016, 4:25 PM     

## 2016-03-21 NOTE — Progress Notes (Signed)
D: Remained isolated to room in bed throughout shift. Denies SI and AVH. Med compliant. A: Encouragement and support provided. Q15 minute checks maintained for safety. Medications given as prescribed. R: Remains safe on unit. Will continue to monitor.

## 2016-03-22 MED ORDER — BUPRENORPHINE HCL 2 MG SL SUBL
4.0000 mg | SUBLINGUAL_TABLET | Freq: Two times a day (BID) | SUBLINGUAL | Status: DC | PRN
  Administered 2016-03-22 – 2016-03-23 (×2): 4 mg via SUBLINGUAL
  Filled 2016-03-22 (×2): qty 2

## 2016-03-22 MED ORDER — GABAPENTIN 100 MG PO CAPS
100.0000 mg | ORAL_CAPSULE | Freq: Two times a day (BID) | ORAL | Status: DC
  Administered 2016-03-22 – 2016-03-24 (×4): 100 mg via ORAL
  Filled 2016-03-22 (×4): qty 1

## 2016-03-22 MED ORDER — QUETIAPINE FUMARATE 100 MG PO TABS
150.0000 mg | ORAL_TABLET | Freq: Every day | ORAL | Status: DC
  Administered 2016-03-22 – 2016-03-23 (×2): 150 mg via ORAL
  Filled 2016-03-22 (×2): qty 2

## 2016-03-22 MED ORDER — GABAPENTIN 300 MG PO CAPS
600.0000 mg | ORAL_CAPSULE | Freq: Every day | ORAL | Status: DC
Start: 2016-03-22 — End: 2016-03-24
  Administered 2016-03-22 – 2016-03-23 (×2): 600 mg via ORAL
  Filled 2016-03-22 (×3): qty 2

## 2016-03-22 NOTE — Progress Notes (Signed)
[facility] MD Progress Note  03/22/2016 9:59 AM Scott Brock  MRN:  [id number]  Subjective:  Scott Brock asked about restarting Suboxone today. He is considering taper to discontinuation of Suboxone while hospitalized. His last dose was 03-21-16 around 1239. Pt shared he is experiencing mild withdrawal symptoms of anxiety, rhinorrhea and anxiety. He is restless on interview. Pt denies diarrhea and lacrimation at this time. Pt shared last heroin use was 03-19-16.   Pt wants to participate in a 9 month rehab program at [facility]. He continues to have cravings but does not want to be on Suboxone/Subtex as he does not believe it is allowed in the rehab program.   Pt denies SI, HI and AVH. He reviewed starting to use opioids after leaving the [employer] due to injuries. He has a 4yo daughter living in [place], [place] he has not seen in several weeks.   Pt has been using heroin for about 19 months. He has participate in one 28/30 day program in the past several months ago. He was sober from heroin use for about 2 weeks. He wants to stop using drugs so he can be a father to his daughter and live a better life.     Principal Problem: Major depressive disorder, single episode, severe without psychotic features ([facility]) Diagnosis:   Patient Active Problem List   Diagnosis Date Noted  . 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 Opioid use disorder, severe, dependence ([facility]) [F11.20] 03/20/2016  . Tobacco use disorder [F17.200] 03/20/2016   Total Time spent with patient: 20 minutes  Past Psychiatric History: Depression, substance use.  Past Medical History:  Past Medical History  Diagnosis Date  . Status post dilation of esophageal narrowing   . Chronic back pain   . Renal disorder   . Asthma   . Acid reflux     Past Surgical History  Procedure Laterality Date  . Upper endoscopy w/ esophageal manometry    . Rhinoplasty     Family  History: History reviewed. No pertinent family history. Family Psychiatric  History: See H&P. Social History:  History  Alcohol Use  . Yes    Comment: social     History  Drug Use  . Yes  . Special: Marijuana    Comment: heroin    Social History   Social History  . Marital Status: Single    Spouse Name: N/A  . Number of Children: N/A  . Years of Education: N/A   Social History Main Topics  . Smoking status: Current Every Day Smoker -- 0.50 packs/day    Types: Cigarettes  . Smokeless tobacco: Never Used  . Alcohol Use: Yes     Comment: social  . Drug Use: Yes    Special: Marijuana     Comment: heroin  . Sexual Activity: Yes    Birth Control/ Protection: None   Other Topics Concern  . None   Social History Narrative   Additional Social History:                         Sleep: Fair  Appetite:  Fair  Current Medications: Current Facility-Administered Medications  Medication Dose Route Frequency Provider Last Rate Last Dose  . acetaminophen (TYLENOL) tablet 650 mg  650 mg Oral Q6H PRN Jolanta B Pucilowska, MD      . alum & mag hydroxide-simeth (MAALOX/MYLANTA) 200-200-20 MG/5ML suspension 30 mL  30 mL Oral Q4H  PRN Shari Prows, MD      . cloNIDine (CATAPRES) tablet 0.1 mg  0.1 mg Oral TID Shari Prows, MD   0.1 mg at 03/21/16 2143  . cyclobenzaprine (FLEXERIL) tablet 10 mg  10 mg Oral TID PRN Shari Prows, MD   10 mg at 03/21/16 2143  . gabapentin (NEURONTIN) capsule 800 mg  800 mg Oral QHS Jolanta B Pucilowska, MD   800 mg at 03/21/16 2143  . ibuprofen (ADVIL,MOTRIN) tablet 600 mg  600 mg Oral Q6H PRN Shari Prows, MD   600 mg at 03/21/16 2143  . loperamide (IMODIUM) capsule 4 mg  4 mg Oral PRN Jolanta B Pucilowska, MD      . magnesium hydroxide (MILK OF MAGNESIA) suspension 30 mL  30 mL Oral Daily PRN Jolanta B Pucilowska, MD      . mometasone-formoterol (DULERA) 200-5 MCG/ACT inhaler 2 puff  2 puff Inhalation BID Shari Prows, MD   2 puff at 03/20/16 2119  . nicotine (NICODERM CQ - dosed in mg/24 hours) patch 21 mg  21 mg Transdermal Q0600 Jolanta B Pucilowska, MD   21 mg at 03/21/16 1003  . promethazine (PHENERGAN) tablet 25 mg  25 mg Oral Q6H PRN Jolanta B Pucilowska, MD      . QUEtiapine (SEROQUEL) tablet 100 mg  100 mg Oral QHS Jolanta B Pucilowska, MD   100 mg at 03/21/16 2143  . traZODone (DESYREL) tablet 100 mg  100 mg Oral QHS Shari Prows, MD   100 mg at 03/21/16 2143    Lab Results:  No results found for this or any previous visit (from the past 48 hour(s)).  Blood Alcohol level:  Lab Results  Component Value Date   ETH <5 03/20/2016   ETH 140* 05/26/2013    Physical Findings: AIMS:  , ,  ,  ,    CIWA:    COWS:  COWS Total Score: 3  Musculoskeletal: Strength & Muscle Tone: within normal limits Gait & Station: normal Patient leans: N/A  Psychiatric Specialty Exam: Review of Systems  Gastrointestinal: Positive for nausea, abdominal pain and diarrhea.  Musculoskeletal: Positive for joint pain and falls.  Psychiatric/Behavioral: Positive for depression, suicidal ideas and substance abuse.  All other systems reviewed and are negative.   Blood pressure 116/75, pulse 62, temperature 97.7 F (36.5 C), temperature source Oral, resp. rate 20, height  (1.854 m), weight 80.287 kg (177 lb), SpO2 99 %.Body mass index is 23.36 kg/(m^2).  General Appearance: Disheveled  Eye Contact::  Minimal  Speech:  Clear and Coherent  Volume:  Normal  Mood:  Anxious and Depressed  Affect:  Blunt  Thought Process:  Goal Directed  Orientation:  Full (Time, Place, and Person)  Thought Content:  WDL  Suicidal Thoughts:  Yes.  with intent/plan  Homicidal Thoughts:  No  Memory:  Immediate;   Fair Recent;   Fair Remote;   Fair  Judgement:  Poor  Insight:  Lacking  Psychomotor Activity:  Decreased  Concentration:  Fair  Recall:  [employer] of Knowledge:Fair  Language: Fair   Akathisia:  No  Handed:  Right  AIMS (if indicated):     Assets:  Communication Skills Desire for Improvement Financial Resources/Insurance Housing Physical Health Resilience Social Support  ADL's:  Intact  Cognition: WNL  Sleep:  Number of Hours: 7   Treatment Plan Summary: Daily contact with patient to assess and evaluate symptoms and progress in treatment and Medication  management   Scott Brock is a 35 year old male with history of opiate dependence and numerous episodes of depression admitted for suicidal ideation in the context of substance use.  1. Suicidal ideation.   - The patient is able to contract for safety.  2. Mood.   - Increase Seroquel to 150mg  po QHS for depression.   3. Opiate detox: improving  - Will add Subutex 4mg  po BID/prn for severe opiate withdrawal.   - Continue symptomatic treatment.   4. Nicotine withdrawal: stable   - Nicotine patch is available.   5. Asthma: stable   - Continue Dulera.  6. Substance abuse treatment. The patient desires residential treatment and has been looking into several faith-based programs.  7. Disposition. He will be discharged to home with his family. He will hopefully follow-up with residential treatment.  Gena FrayMcQueen, Kimmerly Lora G, MD 03/22/2016, 9:59 AM

## 2016-03-22 NOTE — Plan of Care (Signed)
Problem: Consults Goal: Substance Abuse Patient Education See Patient Education Module for education specifics.  Outcome: Progressing Patient instructed use of clonidine and subloxone for detox purposes and its mode of action, he states  Understanding Tax adviserCTownsend RN

## 2016-03-22 NOTE — Progress Notes (Signed)
Pt pleasant and cooperative with care. Isolates to self and room. Comes out for meds. Request prn for spasms and subutex for withdrawal.  Encouragement and support offered. Encouraged to go to group. Pt receptive remains safe on unit with q15  Min safety checks

## 2016-03-22 NOTE — Plan of Care (Signed)
Problem: Ineffective individual coping Goal: STG: Patient will remain free from self harm Outcome: Progressing No self harm reported or observed     

## 2016-03-22 NOTE — Progress Notes (Signed)
Recreation Therapy Notes  Date: 04.10.17 Time: 1:00 pm Location: Craft Room  Group Topic: Wellness  Goal Area(s) Addresses:  Patient will identify at least one item per dimension of health. Patient will examine areas they are deficient in.  Behavioral Response: Arrived late, Attentive  Intervention: 6 Dimensions of Health  Activity: Patients were given a definition sheet with the 6 Dimensions of Health on it. Patients were given a worksheet with the 6 Dimensions of Health on it and were encouraged to think of 2-3 things they are currently doing in each dimension.  Education: LRT educated patients on ways they can increase each dimension.  Education Outcome: In group clarification offered  Clinical Observations/Feedback: Patient arrived to group at approximately 1:30 pm. LRT explained activity. Patient did not participate in activity. Patient did not contribute to group discussion.  Jacquelynn CreeGreene,Lua Feng M, LRT/CTRS 03/22/2016 2:29 PM

## 2016-03-22 NOTE — [facility] Group Notes (Signed)
[facility]  03/22/2016 4:45 PM  Type of Therapy:  Group Therapy  Participation Level:  Did Not Attend    Glennon MacLaws, Emanuelle Hammerstrom P, MSW, LCSW 03/22/2016, 4:45 PM
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
## 2016-03-22 NOTE — Progress Notes (Signed)
NUTRITION ASSESSMENT  Pt identified as at risk on the Malnutrition Screen Tool  INTERVENTION: 1. Encourage importance of nutrition and  intake of food and beverages. 2 Encourage menu completion to best meet pt preference to encourage po intake  NUTRITION DIAGNOSIS: N/A  Goal: Pt to meet >/= 90% of their estimated nutrition needs.  Monitor:  PO intake  Assessment:   35 y.o. male admitted with major depressive disorder, single episode, severe without psychotic features  Height: Ht Readings from Last 1 Encounters:  03/20/16 6\' 1"  (1.854 m)    Weight: Wt Readings from Last 1 Encounters:  03/20/16 177 lb (80.287 kg)    Weight Hx:no strong wt loss trend per wt encounters Wt Readings from Last 10 Encounters:  03/20/16 177 lb (80.287 kg)  03/19/16 180 lb (81.647 kg)  03/16/16 181 lb (82.101 kg)  07/06/15 181 lb (82.101 kg)  02/05/15 190 lb (86.183 kg)  11/02/14 176 lb (79.833 kg)  08/07/14 151 lb 12.8 oz (68.856 kg)  06/16/14 152 lb (68.947 kg)  01/22/14 200 lb (90.719 kg)  05/26/13 200 lb (90.719 kg)    BMI:  Body mass index is 23.36 kg/(m^2).  Estimated Nutritional Needs: Kcal: 25-30 kcal/kg Protein: > 1 gram protein/kg Fluid: 1 ml/kcal  Diet Order: Diet regular Room service appropriate?: Yes; Fluid consistency:: Thin Pt is also offered choice of unit snacks through out the day as per scheduled Pt eating >50% of meal trays at present Pt is eating as desired.   Lab results and medications reviewed.   Romelle Starcherate Ailish Prospero MS, RD, LDN [phone] Pager  [phone] Weekend/On-Call Pager

## 2016-03-22 NOTE — Plan of Care (Signed)
Problem: Consults Goal: Substance Abuse Patient Education See Patient Education Module for education specifics.  Outcome: Progressing Patient instructed purpose of clonidine for detox and he states understanding and complient with treatment at this time    Lucretia Kern Darolyn Double RN

## 2016-03-22 NOTE — [facility] Suicide Risk Assessment (Signed)
[facility] INPATIENT:  Family/Significant Other Suicide Prevention Education  Suicide Prevention Education:  Patient Refusal for Family/Significant Other Suicide Prevention Education: The patient Scott Brock has refused to provide written consent for family/significant other to be provided Family/Significant Other Suicide Prevention Education during admission and/or prior to discharge.  Physician notified.  Pt refused SPE from the CSW.  Dorothe PeaJonathan F Velva Molinari 03/22/2016, 3:40 PM

## 2016-03-22 NOTE — [facility] Group Notes (Signed)
[facility] Group Notes:  (Nursing/MHT/Case Management/Adjunct)  Date:  03/22/2016  Time:  11:22 PM  Type of Therapy:  Evening Wrap-up Group  Participation Level:  Active  Participation Quality:  Appropriate and Attentive  Affect:  Appropriate  Cognitive:  Alert and Appropriate  Insight:  Appropriate, Good and Improving  Engagement in Group:  Developing/Improving and Engaged  Modes of Intervention:  Discussion  Summary of Progress/Problems: Patient states he is feeling positive and wants to focus on being a better Father for his daughter.  Brevan Luberto Nanta Harris Penton 03/22/2016, 11:22 PM

## 2016-03-22 NOTE — Progress Notes (Signed)
D: Pt denies SI. Attended evening group. Pleasant during interaction. Pt stated that  "I used suboxone to get a bigger high off of other things and I want to detox off everything"  Given Motrin at 2143 for generalized body aches. Pt currently resting with eyes closed. A: Encouragement and support provided. Q15 minute checks maintained for safety. Medications given as prescribed. R: Remains safe on unit. Voices no additional concerns at this time. Will continue to monitor.

## 2016-03-22 NOTE — [facility] Group Notes (Signed)
[facility] Group Notes:  (Nursing/MHT/Case Management/Adjunct)  Date:  03/22/2016  Time:  4:19 AM  Type of Therapy:  Group Therapy  Participation Level:  Active  Participation Quality:  Appropriate  Affect:  Appropriate  Cognitive:  Appropriate  Insight:  Appropriate  Engagement in Group:  Engaged  Modes of Intervention:  n/a  Summary of Progress/Problems:  Veva Holesshley Imani Jamaurie Bernier 03/22/2016, 4:19 AM

## 2016-03-22 NOTE — Plan of Care (Signed)
Problem: Ineffective individual coping Goal: LTG: Patient will report a decrease in negative feelings Outcome: Progressing Patient reports no negative feelings at this time ,he is planning his goals for  Discharge  [place] RN

## 2016-03-22 NOTE — Progress Notes (Signed)
D: Patient is alert and oriented on the unit this shift. Patient attended and actively participated in groups today. Patient denies suicidal ideation, homicidal ideation, auditory or visual hallucinations at the present time.  A: Scheduled  And prn medications are administered to patient as per MD orders. Emotional support and encouragement are provided. Patient is maintained on q.15 minute safety checks. Patient is informed to notify staff with questions or concerns. R: No adverse medication reactions are noted. Patient is cooperative with medication administration and treatment plan today. Patient is receptive, calm and cooperative on the unit at this time. Patient interacts well with others on the unit this shift. Patient contracts for safety at this time. Patient remains safe at this time.

## 2016-03-23 DIAGNOSIS — F17200 Nicotine dependence, unspecified, uncomplicated: Secondary | ICD-10-CM

## 2016-03-23 DIAGNOSIS — F1120 Opioid dependence, uncomplicated: Secondary | ICD-10-CM

## 2016-03-23 MED ORDER — BUPRENORPHINE HCL 2 MG SL SUBL
4.0000 mg | SUBLINGUAL_TABLET | Freq: Every day | SUBLINGUAL | Status: DC | PRN
  Administered 2016-03-24: 4 mg via SUBLINGUAL
  Filled 2016-03-23: qty 2

## 2016-03-23 NOTE — Plan of Care (Signed)
Problem: Ineffective individual coping Goal: STG: Pt will be able to identify effective and ineffective STG: Pt will be able to identify effective and ineffective coping patterns  Outcome: Not Progressing Patient refused to attend groups. He also refused to take his medications initially but came later at about 1040, asking for his medications. Laid in bed most of the morning.

## 2016-03-23 NOTE — Progress Notes (Signed)
[facility] MD Progress Note  03/23/2016 9:09 AM Scott Brock  MRN:  [id number]  Subjective:    Nursing notes reviewed. Pleasant and cooperative. Out of room for meds.  PRNs for spasms and Subutex provided for withdrawal.  Pt did attend some groups but did not participate. Medication compliant.   Today on interview, the pt is resting comfortably in bed. He states he is happy to have slept last night with the current dosages of medications. He feels somewhat fatigued this morning but does not want to adjust medications. Pt also shared he feels less restless and nervous as compared to yesterday. He feels dosage of Subutex was beneficial.    Pt shared yesterday he has an upcoming court date on 04-01-16. He is also asking to be discharged to see his daughter before going into substance abuse treatment.   Cravings have decreased. He denies stiffness, TD, EPS and akathisia. He denies SI, HI and AVH.   Principal Problem: Major depressive disorder, single episode, severe without psychotic features (HCC)   Diagnosis:   Patient Active Problem List   Diagnosis Date Noted  . 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 Opioid use disorder, severe, dependence (HCC) [F11.20] 03/20/2016  . Tobacco use disorder [F17.200] 03/20/2016   Total Time spent with patient: 15 minutes  Past Psychiatric History: Depression, substance use.  Past Medical History:  Past Medical History  Diagnosis Date  . Status post dilation of esophageal narrowing   . Chronic back pain   . Renal disorder   . Asthma   . Acid reflux     Past Surgical History  Procedure Laterality Date  . Upper endoscopy w/ esophageal manometry    . Rhinoplasty     Family History: History reviewed. No pertinent family history. Family Psychiatric  History: See H&P. Social History:  History  Alcohol Use  . Yes    Comment: social     History  Drug Use  . Yes  . Special: Marijuana     Comment: heroin    Social History   Social History  . Marital Status: Single    Spouse Name: N/A  . Number of Children: N/A  . Years of Education: N/A   Social History Main Topics  . Smoking status: Current Every Day Smoker -- 0.50 packs/day    Types: Cigarettes  . Smokeless tobacco: Never Used  . Alcohol Use: Yes     Comment: social  . Drug Use: Yes    Special: Marijuana     Comment: heroin  . Sexual Activity: Yes    Birth Control/ Protection: None   Other Topics Concern  . None   Social History Narrative   Additional Social History:  One 4yo daughter who lives in [place], [place].  Divorced.   Sleep: Fair  Appetite:  Fair  Current Medications: Current Facility-Administered Medications  Medication Dose Route Frequency Provider Last Rate Last Dose  . acetaminophen (TYLENOL) tablet 650 mg  650 mg Oral Q6H PRN Jolanta B Pucilowska, MD      . alum & mag hydroxide-simeth (MAALOX/MYLANTA) 200-200-20 MG/5ML suspension 30 mL  30 mL Oral Q4H PRN Jolanta B Pucilowska, MD      . buprenorphine (SUBUTEX) SL tablet 4 mg  4 mg Sublingual BID PRN Gena Fray, MD   4 mg at 03/22/16 1404  . cloNIDine (CATAPRES) tablet 0.1 mg  0.1 mg Oral TID Shari Prows, MD   0.1 mg at 03/22/16 2003  .  cyclobenzaprine (FLEXERIL) tablet 10 mg  10 mg Oral TID PRN Shari Prows, MD   10 mg at 03/22/16 2215  . gabapentin (NEURONTIN) capsule 100 mg  100 mg Oral BID Gena Fray, MD   100 mg at 03/22/16 1556  . gabapentin (NEURONTIN) capsule 600 mg  600 mg Oral QHS Gena Fray, MD   600 mg at 03/22/16 2227  . ibuprofen (ADVIL,MOTRIN) tablet 600 mg  600 mg Oral Q6H PRN Shari Prows, MD   600 mg at 03/21/16 2143  . loperamide (IMODIUM) capsule 4 mg  4 mg Oral PRN Jolanta B Pucilowska, MD      . magnesium hydroxide (MILK OF MAGNESIA) suspension 30 mL  30 mL Oral Daily PRN Jolanta B Pucilowska, MD      . mometasone-formoterol (DULERA) 200-5 MCG/ACT inhaler 2 puff  2 puff Inhalation  BID Shari Prows, MD   2 puff at 03/20/16 2119  . nicotine (NICODERM CQ - dosed in mg/24 hours) patch 21 mg  21 mg Transdermal Q0600 Jolanta B Pucilowska, MD   21 mg at 03/21/16 1003  . promethazine (PHENERGAN) tablet 25 mg  25 mg Oral Q6H PRN Jolanta B Pucilowska, MD      . QUEtiapine (SEROQUEL) tablet 150 mg  150 mg Oral QHS Gena Fray, MD   150 mg at 03/22/16 2225  . traZODone (DESYREL) tablet 100 mg  100 mg Oral QHS Jolanta B Pucilowska, MD   100 mg at 03/22/16 2228    Lab Results:  No results found for this or any previous visit (from the past 48 hour(s)).  Blood Alcohol level:  Lab Results  Component Value Date   ETH <5 03/20/2016   ETH 140* 05/26/2013    Physical Findings: AIMS:  , ,  ,  ,    CIWA:    COWS:  COWS Total Score: 1  Musculoskeletal: Strength & Muscle Tone: within normal limits Gait & Station: normal Patient leans: N/A  Psychiatric Specialty Exam: Review of Systems  Gastrointestinal: Negative for nausea, abdominal pain and diarrhea.  Musculoskeletal: Negative for joint pain and falls.  Psychiatric/Behavioral: Positive for substance abuse. Negative for depression, suicidal ideas, hallucinations and memory loss. The patient is not nervous/anxious and does not have insomnia.   All other systems reviewed and are negative.   Blood pressure 101/67, pulse 67, temperature 97.8 F (36.6 C), temperature source Oral, resp. rate 18, height  (1.854 m), weight 80.287 kg (177 lb), SpO2 99 %.Body mass index is 23.36 kg/(m^2).  General Appearance: Disheveled  Eye Contact:  Good  Speech:  Clear and Coherent  Volume:  Normal  Mood:  Fatigued  Affect:  Full Range  Thought Process:  Goal Directed  Orientation:  Full (Time, Place, and Person)  Thought Content:  WDL  Suicidal Thoughts:  No  Homicidal Thoughts:  No  Memory:  Immediate;   Fair Recent;   Fair Remote;   Fair  Judgement:  Fair  Insight:  Lacking  Psychomotor Activity:  Normal   Concentration:  Fair  Recall:  [employer] of Knowledge:Fair  Language: Fair  Akathisia:  No  Handed:  Right  AIMS (if indicated):     Assets:  Communication Skills Desire for Improvement Financial Resources/Insurance Housing Physical Health Resilience Social Support  ADL's:  Intact  Cognition: WNL  Sleep:  Number of Hours: 6   Treatment Plan Summary: Daily contact with patient to assess and evaluate symptoms and progress in treatment and  Medication management   Mr. [name] is a 35 year old male with history of opiate dependence and numerous episodes of depression admitted for suicidal ideation in the context of substance use.  1. Suicidal ideation.   - The patient is able to contract for safety.  2. Mood.   - Continue Seroquel to 150mg  po QHS for depression.   3. Opiate detox: improving  - Decrease Subutex to 4mg  po QD/prn for severe opiate withdrawal.   - Continue symptomatic treatment.   4. Nicotine withdrawal: stable   - Nicotine patch is available.   5. Asthma: stable   - Continue Dulera.  6. Substance abuse treatment. The patient desires residential treatment and has been looking into several faith-based programs.  7. Disposition. He will be discharged to home with his family. He will hopefully follow-up with residential treatment.  - Discharge 03-24-16  Gena FrayMcQueen, Dionisio Aragones G, MD 03/23/2016, 9:09 AM

## 2016-03-23 NOTE — BHH Group Notes (Signed)
Adult Psychoeducational Group Note  Date:  03/23/2016 Time:  2:50 PM  Group Topic/Focus:  Recovery Goals:   The focus of this group is to identify appropriate goals for recovery and establish a plan to achieve them.  Participation Level:  Active  Participation Quality:  Intrusive  Affect:  Anxious  Cognitive:  Appropriate  Insight: Appropriate  Engagement in Group:  Improving  Modes of Intervention:  Education  Additional Comments:  Patient stated when he gets discharged he was going to a faith based program that is nine months.  Patient stated the program will help him with a getting a job and finding a place to live.    Scott Brock, Scott Brock 03/23/2016, 2:50 PM

## 2016-03-23 NOTE — Progress Notes (Signed)
Patient slept through most part of the morning, was hard to arouse and he initially refused his morning medications. Patient then later came to staff and asked to take his medications. He denied SI/HI/AV/VH and contracted for safety.

## 2016-03-23 NOTE — Plan of Care (Signed)
Problem: Ineffective individual coping Goal: STG: Pt will be able to identify effective and ineffective STG: Pt will be able to identify effective and ineffective coping patterns  Outcome: Progressing Attended afternoon groups.

## 2016-03-24 MED ORDER — QUETIAPINE FUMARATE 50 MG PO TABS: 150.0000 mg | ORAL_TABLET | Freq: Every day | ORAL | Status: DC

## 2016-03-24 MED ORDER — CLONIDINE HCL 0.1 MG PO TABS: 0.1000 mg | ORAL_TABLET | Freq: Three times a day (TID) | ORAL | Status: DC

## 2016-03-24 MED ORDER — GABAPENTIN 100 MG PO CAPS: 100.0000 mg | ORAL_CAPSULE | Freq: Two times a day (BID) | ORAL | Status: DC

## 2016-03-24 MED ORDER — TRAZODONE HCL 100 MG PO TABS: 100.0000 mg | ORAL_TABLET | Freq: Every day | ORAL | Status: DC

## 2016-03-24 MED ORDER — CYCLOBENZAPRINE HCL 10 MG PO TABS: 10.0000 mg | ORAL_TABLET | Freq: Three times a day (TID) | ORAL | Status: DC | PRN

## 2016-03-24 MED ORDER — GABAPENTIN 300 MG PO CAPS: 600.0000 mg | ORAL_CAPSULE | Freq: Every day | ORAL | Status: DC

## 2016-03-24 NOTE — Progress Notes (Signed)
Calm and cooperative. Visible on milieu. Interacting with peers and staff. Med compliant. Attends group. No voiced thoughts of hurting himself. No c/o pain/discomfort noted.

## 2016-03-24 NOTE — Tx Team (Signed)
Interdisciplinary Treatment Plan Update (Adult)         Date: 03/24/2016   Time Reviewed: 9:30 AM   Progress in Treatment: Improving Attending groups: Intermittently  Participating in groups: Intermittently Taking medication as prescribed: Yes  Tolerating medication: Yes  Family/Significant other contact made: No, pt refused family contact Patient understands diagnosis: Yes  Discussing patient identified problems/goals with staff: Yes  Medical problems stabilized or resolved: Yes  Denies suicidal/homicidal ideation: Yes  Issues/concerns per patient self-inventory: Yes  Other:   New problem(s) identified: N/A   Discharge Plan or Barriers: Pt will discharge home to [place], [place] and follow up with either [facility] or the [place], [place] for long-term inpatient substance abuse treatment   Reason for Continuation of Hospitalization:   Depression   Anxiety   Medication Stabilization   Comments: N/A   Estimated date of discharge: 03/24/16    Patient is a 35 year old male with a history of opiate addiction and new onset depression.  Pt's chief complaint. "I need treatment."  Pt lives in [place], [place].  History of present illness. Information was obtained from the patient and the chart. The patient has been snorting heroine 2-3 grams a day. He has been trying to obtain treatment and had been to Suboxone clinic but did not find it helpful. 2 months ago he went to [facility] for rehabilitation but continued on Suboxone which he now thinks was a mistake. He has not been able to maintain any sobriety. He is almost homeless as his mother would not tolerate his addiction and a longer. He started looking into [facility] as he that they will not allow any medication. He's been trying to get detox with not much success. In the process his mood has been going down. He reports poor sleep, decreased appetite,  anhedonia, feeling of guilt and hopelessness worthlessness, poor energy and concentration, social isolation. He has been so frustrated with his failure to get treatment that now he reports suicidal thoughts with a plan to overdose. He does have [employer] privileges but no beds are available. In addition he does not think highly of the [place] system. He denies psychotic symptoms or excessive anxiety. He denies symptoms suggestive of bipolar mania. He denies alcohol or other illicit substance use.   Past psychiatric history. Has never been hospitalized. There were no suicide attempts. He did attended Suboxone clinic and completed rehabilitation and [place].  Family psychiatric history. Nonreported.  Social history. He is retired from [employer]. He lives with his mother that may not longer be possible.enefit from crisis stabilization, medication evaluation, group therapy, and psycho education in addition to case management for discharge planning. Patient and CSW reviewed pt's identified goals and treatment plan. Pt verbalized understanding and agreed to treatment plan.    Review of initial/current patient goals per problem list:  1. Goal(s): Patient will participate in aftercare plan   Met: Yes  Target date: 3-5 days post admission date   As evidenced by: Patient will participate within aftercare plan AEB aftercare provider and housing plan at discharge being identified.   4/12: Pt will discharge home to [place], [place] and follow up with either [facility], [facility] or the [place], [place] for long-term inpatient substance abuse treatment     2. Goal (s): Patient will exhibit decreased depressive symptoms and suicidal ideations.   Met: Adequate for discharge per MD.  Target date: 3-5 days post admission date   As  evidenced by: Patient will utilize self-rating of depression at 3 or below and demonstrate decreased signs of depression or be deemed stable for discharge by MD.    4/12: Adequate for discharge per MD.  Pt denies SI/HI.  Pt reports he is safe for discharge.     3. Goal(s): Patient will demonstrate decreased signs and symptoms of anxiety.   Met: Adequate for discharge per MD.  Target date: 3-5 days post admission date   As evidenced by: Patient will utilize self-rating of anxiety at 3 or below and demonstrated decreased signs of anxiety, or be deemed stable for discharge by MD   4/12: Adequate for discharge per MD.  Pt reports baseline symptoms of anxiety    4. Goal(s): Patient will demonstrate decreased signs of withdrawal due to substance abuse   Met: Adequate for discharge per MD.  Target date: 3-5 days post admission date   As evidenced by: Patient will produce a CIWA/COWS score of 0, have stable vitals signs, and no symptoms of withdrawal   4/12: Adequate for discharge per MD.   Attendees:  Patient: Scott Brock Family:  Physician: Dr. Tami Ribas, MD       03/24/2016 9:30 AM  Nursing: Carolynn Sayers, RN       03/24/2016 9:30 AM  Clinical Social Worker: Marylou Flesher, [place]     03/24/2016 9:30 AM  Clinical Social Worker: Nile Riggs, LCSW     03/24/2016 9:30 AM  Psychologist: Byrd Hesselbach Roush       03/24/2016 9:30 AM  Other:           03/24/2016 9:30 AM  Other:           03/24/2016 9:30 AM     Alphonse Guild. Zhane Bluitt, LCSWA, LCAS  03/24/16

## 2016-03-24 NOTE — Progress Notes (Signed)
Pleasant and cooperative.  Denies SI/HI/AVH. Discharge instructions given, verbalized understanding.  Prescriptions and crisis card given.  Personal belongings returned.  Escorted off unit by this Clinical research associatewriter to ED parking lot to obtain his vehicle to travel home.

## 2016-03-24 NOTE — Progress Notes (Signed)
[facility] MD Progress Note  03/24/2016 8:50 AM Scott Brock  MRN:  [id number]  Subjective:    Nursing notes reviewed. Pleasant and cooperative. Variably attended groups. Visible in milieu. Medication compliant. Interacting with peers and staff.  Denies SI.   Today on interview, pt is calm and cooperative. He shared he is eating and sleeping well. He denies medication side effects. Upon discharge, he is going to contact his father and try to work with him again. He also reviewed methods he will institute to not use drugs after discharge. He admits to continued cravings but he feels those will always be present.  Pt was provided local NA group information by SW.   He denies stiffness, TD, EPS and akathisia. He denies SI, HI and AVH. Pt denies SI, HI and AVH. Pt asked for a small supply of clonidine and Flexeril for withdrawals.   Principal Problem: Major depressive disorder, single episode, severe without psychotic features (HCC)   Diagnosis:   Patient Active Problem List   Diagnosis Date Noted  . 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 Opioid use disorder, severe, dependence (HCC) [F11.20] 03/20/2016  . Tobacco use disorder [F17.200] 03/20/2016   Total Time spent with patient: 15 minutes  Past Psychiatric History: Depression, substance use.  Past Medical History:  Past Medical History  Diagnosis Date  . Status post dilation of esophageal narrowing   . Chronic back pain   . Renal disorder   . Asthma   . Acid reflux     Past Surgical History  Procedure Laterality Date  . Upper endoscopy w/ esophageal manometry    . Rhinoplasty     Family History: History reviewed. No pertinent family history. Family Psychiatric  History: See H&P. Social History:  History  Alcohol Use  . Yes    Comment: social     History  Drug Use  . Yes  . Special: Marijuana    Comment: heroin    Social History   Social History  . Marital  Status: Single    Spouse Name: N/A  . Number of Children: N/A  . Years of Education: N/A   Social History Main Topics  . Smoking status: Current Every Day Smoker -- 0.50 packs/day    Types: Cigarettes  . Smokeless tobacco: Never Used  . Alcohol Use: Yes     Comment: social  . Drug Use: Yes    Special: Marijuana     Comment: heroin  . Sexual Activity: Yes    Birth Control/ Protection: None   Other Topics Concern  . None   Social History Narrative   Additional Social History:  One 4yo daughter who lives in [place], [place].  Divorced.   Sleep: Fair  Appetite:  Fair  Current Medications: Current Facility-Administered Medications  Medication Dose Route Frequency Provider Last Rate Last Dose  . acetaminophen (TYLENOL) tablet 650 mg  650 mg Oral Q6H PRN Jolanta B Pucilowska, MD      . alum & mag hydroxide-simeth (MAALOX/MYLANTA) 200-200-20 MG/5ML suspension 30 mL  30 mL Oral Q4H PRN Jolanta B Pucilowska, MD      . buprenorphine (SUBUTEX) SL tablet 4 mg  4 mg Sublingual Daily PRN Gena Fray, MD      . cloNIDine (CATAPRES) tablet 0.1 mg  0.1 mg Oral TID Shari Prows, MD   0.1 mg at 03/23/16 2202  . cyclobenzaprine (FLEXERIL) tablet 10 mg  10 mg Oral TID PRN Jolanta B  Pucilowska, MD   10 mg at 03/23/16 1603  . gabapentin (NEURONTIN) capsule 100 mg  100 mg Oral BID Gena Fray, MD   100 mg at 03/23/16 1603  . gabapentin (NEURONTIN) capsule 600 mg  600 mg Oral QHS Gena Fray, MD   600 mg at 03/23/16 2202  . ibuprofen (ADVIL,MOTRIN) tablet 600 mg  600 mg Oral Q6H PRN Shari Prows, MD   600 mg at 03/21/16 2143  . loperamide (IMODIUM) capsule 4 mg  4 mg Oral PRN Jolanta B Pucilowska, MD      . magnesium hydroxide (MILK OF MAGNESIA) suspension 30 mL  30 mL Oral Daily PRN Jolanta B Pucilowska, MD      . mometasone-formoterol (DULERA) 200-5 MCG/ACT inhaler 2 puff  2 puff Inhalation BID Shari Prows, MD   2 puff at 03/20/16 2119  . nicotine (NICODERM CQ -  dosed in mg/24 hours) patch 21 mg  21 mg Transdermal Q0600 Jolanta B Pucilowska, MD   21 mg at 03/23/16 1046  . promethazine (PHENERGAN) tablet 25 mg  25 mg Oral Q6H PRN Jolanta B Pucilowska, MD      . QUEtiapine (SEROQUEL) tablet 150 mg  150 mg Oral QHS Gena Fray, MD   150 mg at 03/23/16 2203  . traZODone (DESYREL) tablet 100 mg  100 mg Oral QHS Shari Prows, MD   100 mg at 03/23/16 2202    Lab Results:  No results found for this or any previous visit (from the past 48 hour(s)).  Blood Alcohol level:  Lab Results  Component Value Date   ETH <5 03/20/2016   ETH 140* 05/26/2013    Physical Findings: AIMS: 0 CIWA:    COWS:  COWS Total Score: 1  Musculoskeletal: Strength & Muscle Tone: within normal limits Gait & Station: normal Patient leans: N/A  Psychiatric Specialty Exam: Review of Systems  Gastrointestinal: Negative for nausea, abdominal pain and diarrhea.  Musculoskeletal: Negative for joint pain and falls.  Psychiatric/Behavioral: Positive for substance abuse. Negative for depression, suicidal ideas, hallucinations and memory loss. The patient is not nervous/anxious and does not have insomnia.   All other systems reviewed and are negative.   Blood pressure 123/68, pulse 66, temperature 97.6 F (36.4 C), temperature source Oral, resp. rate 18, height  (1.854 m), weight 80.287 kg (177 lb), SpO2 99 %.Body mass index is 23.36 kg/(m^2).  General Appearance: Disheveled  Eye Contact:  Good  Speech:  Clear and Coherent  Volume:  Normal  Mood:  Fatigued  Affect:  Full Range  Thought Process:  Goal Directed  Orientation:  Full (Time, Place, and Person)  Thought Content:  WDL  Suicidal Thoughts:  No  Homicidal Thoughts:  No  Memory:  Immediate;   Fair Recent;   Fair Remote;   Fair  Judgement:  Fair  Insight:  Lacking  Psychomotor Activity:  Normal  Concentration:  Fair  Recall:  [employer] of Knowledge:Fair  Language: Fair  Akathisia:  No  Handed:   Right  AIMS (if indicated):     Assets:  Communication Skills Desire for Improvement Financial Resources/Insurance Housing Physical Health Resilience Social Support  ADL's:  Intact  Cognition: WNL  Sleep:  Number of Hours: 6.75   Treatment Plan Summary: Daily contact with patient to assess and evaluate symptoms and progress in treatment and Medication management   Mr. Huffaker is a 35 year old male with history of opiate dependence and numerous episodes of depression admitted for  suicidal ideation in the context of substance use.  1. Suicidal ideation.   - The patient is able to contract for safety.  2. Mood.   - Continue Seroquel to 150mg  po QHS for depression.   3. Opiate detox: improving  - Discontinue Subutex to 4mg  po QD/prn for severe opiate withdrawal.   - Continue symptomatic treatment.   4. Nicotine withdrawal: stable   - Nicotine patch is available.   5. Asthma: stable   - Continue Dulera.  6. Substance abuse treatment. The patient desires residential treatment and has been looking into several faith-based programs.  7. Disposition. He will be discharged to home with his family. He will hopefully follow-up with residential treatment.  - Discharge 03-24-16  Gena FrayMcQueen, Leyla Soliz G, MD 03/24/2016, 8:50 AM

## 2016-03-24 NOTE — Progress Notes (Signed)
  [facility] :  Will you be returning to the same living situation after discharge:  Yes,  [place] to live with his mother At discharge, do you have transportation home?: Yes,  pt will drive home Do you have the ability to pay for your medications: Yes,  pt will be provided with prescriptions at discharge  Release of information consent forms completed and in the chart;  Patient's signature needed at discharge.  Patient to Follow up at: Follow-up Information    Please follow up.   Contact information:          Follow up with [facility].   Why:  Call the office or email them at: [email or web address] to seek admittance for residential substance abuse treatment, after your court date on April 20th, 2017   Contact information:   [address] [place], [place] [address] Ph: [phone] Do not fax;Fax not needed      [facility]   Why:  Please [facility] to be assessed for inpatient treatment in the residential substance abuse treatment in [place].  The [facility] should contact you within two business days to schedule an appointment if you prefer.   Contact information:   [address], [place], [place] [address] Ph: [phone] ext [phone] Fax: [phone]      Follow up with [facility].   Why:  Please call upon discharge to check on the status of your application for residential long-term substance abuse treatment   Contact information:   [facility]  [address] [place], [place] [address] Phone: [phone] Fax: [phone]        Next level of care provider has access to [facility] Link:no  Safety Planning and Suicide Prevention discussed: Yes,  completed with pt  Have you used any form of tobacco in the last 30 days? (Cigarettes, Smokeless Tobacco, Cigars, and/or  Pipes): Yes  Has patient been referred to the Quitline?: Patient refused referral  Patient has been referred for addiction treatment: Yes  Dorothe PeaJonathan F Soma Bachand 03/24/2016, 2:35 PM

## 2016-03-24 NOTE — [facility] Suicide Risk Assessment (Signed)
Suicide Risk Assessment  Discharge Assessment     Current Mental Status by Physician: Patient denies suicidal or homicidal ideation, hallucinations, illusions, or delusions. Patient engages with good eye contact, is able to focus adequately in a one to one setting, and has clear goal directed thoughts. Patient speaks with a natural conversational volume, rate, and tone. Anxiety was reported at 0 on a scale of 1 the least and 10 the most. Depression was reported at 0 on the same scale. Patient is oriented times 4, recent and remote memory intact. Judgement: fair Insight: fair  Demographic factors:    Loss Factors:    Historical Factors:    Risk Reduction Factors:     Continued Clinical Symptoms:  Depression:   Severe Alcohol/Substance Abuse/Dependencies More than one psychiatric diagnosis  Discharge Diagnoses: MDD, recurrent, severe without psychotic features Opioid use disorder, severe Nicotine use disorder, severe  Cognitive Features That Contribute To Risk:  None    Suicide Risk:  Minimal: No identifiable suicidal ideation.  Patients presenting with no risk factors but with morbid ruminations; may be classified as minimal risk based on the severity of the depressive symptoms  Labs:  No results found for this or any previous visit (from the past 72 hour(s)). RISK REDUCTION FACTORS: What pt has learned from hospital stay is how to manage withdrawals. The importance of not doing drugs so he can help his daughter. The importance of attending substance abuse groups and treatment. The importance of medication compliance.   Risk of self harm is low.  Risk of harm to others is low.  Pt seen in treatment team 03/24/2016   PLAN: Discharge home Continue   Medication List    ASK your doctor about these medications      Indication   clindamycin 300 MG capsule  Commonly known as:  CLEOCIN  Take 300 mg by mouth 3 (three) times daily. Reported on 03/20/2016      Fluticasone-Salmeterol 250-50 MCG/DOSE Aepb  Commonly known as:  ADVAIR  Inhale 1 puff into the lungs every 12 (twelve) hours as needed.      gabapentin 800 MG tablet  Commonly known as:  NEURONTIN  Take 800 mg by mouth at bedtime. Reported on 03/20/2016      ibuprofen 800 MG tablet  Commonly known as:  ADVIL,MOTRIN  Take 800 mg by mouth every 8 (eight) hours as needed.        Follow-up recommendations:  Activities: Resume typical activities Diet: Resume typical diet Tests: Standard labs to in management of antipsychotic medication.  Other: Follow up with outpatient provider and report any side effects to out patient prescriber.  Is patient on multiple antipsychotic therapies at discharge:  No  Has Patient had three or more failed trials of antipsychotic monotherapy by history: N/A Recommended Plan for Multiple Antipsychotic Therapies: N/A  Scott Brock, Scott Brock 03/24/2016 8:52 AM

## 2016-03-24 NOTE — Discharge Summary (Signed)
Physician Discharge Summary Note  Patient:  Scott Brock is an [age] y.o., male MRN:  [id number] DOB:  November 03, 1981    Date of Admission:  03/20/2016  Date of Discharge: 03/24/2016  Reason for Admission:  History of present illness. Information was obtained from the patient and the chart. The patient has been snorting heroine 2-3 grams a day. He has been trying to obtain treatment and had been to Suboxone clinic but did not find it helpful. 2 months ago he [facility] but continued on Suboxone which he now thinks was a mistake. He has not been able to maintain any sobriety. He is almost homeless as his mother would not tolerate his addiction and a longer. [facility] as he that they will not allow any medication. He's been trying to get detox with not much success. In the process his mood has been going down. He reports poor sleep, decreased appetite, anhedonia, feeling of guilt and hopelessness worthlessness, poor energy and concentration, social isolation. He has been so frustrated with his failure to get treatment that now he reports suicidal thoughts with a plan to overdose. He does have [facility] privileges but no beds are available. In [place] [facility]. He denies psychotic symptoms or excessive anxiety. He denies symptoms suggestive of bipolar mania. He denies alcohol or other illicit substance use.   Principal Problem: Major depressive disorder, single episode, severe without psychotic features [facility](HCC) Discharge Diagnoses: Patient Active Problem List   Diagnosis Date Noted  . 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 Opioid use disorder, severe, dependence (HCC) [F11.20] 03/20/2016  . Tobacco use disorder [F17.200] 03/20/2016    Musculoskeletal: Strength & Muscle Tone: within normal limits Gait & Station: normal Patient  leans: N/A  Psychiatric Specialty Exam: Physical Exam  ROS  Blood pressure 123/68, pulse 66, temperature 97.6 F (36.4 C), temperature source Oral, resp. rate 18, height 6\' 1"  (1.854 m), weight 80.287 kg (177 lb), SpO2 99 %.Body mass index is 23.36 kg/(m^2).  General Appearance: Disheveled  Eye Contact: Good  Speech: Clear and Coherent  Volume: Normal  Mood: Fatigued  Affect: Full Range  Thought Process: Goal Directed  Orientation: Full (Time, Place, and Person)  Thought Content: WDL  Suicidal Thoughts: No  Homicidal Thoughts: No  Memory: Immediate; Fair Recent; Fair Remote; Fair  Judgement: Fair  Insight: Lacking  Psychomotor Activity: Normal  Concentration: Fair  Recall: [employer]  Fund of Knowledge:Fair  Language: Fair  Akathisia: No  Handed: Right  AIMS (if indicated):    Assets: Communication Skills Desire for Improvement Financial Resources/Insurance Housing Physical Health Resilience Social Support  ADL's: Intact  Cognition: WNL  Sleep: Number of Hours: 6.75          Past Medical History:  Past Medical History  Diagnosis Date  . Status post dilation of esophageal narrowing   . Chronic back pain   . Renal disorder   . Asthma   . Acid reflux     Past Surgical History  Procedure Laterality Date  . Upper endoscopy w/ esophageal manometry    . Rhinoplasty     Family History: History reviewed. No pertinent family history. Social History:  History  Alcohol Use  . Yes    Comment: social     History  Drug Use  . Yes  . Special: Marijuana    Comment: heroin  Social History   Social History  . Marital Status: Single    Spouse Name: N/A  . Number of Children: N/A  . Years of Education: N/A   Social History Main Topics  . Smoking status: Current Every Day Smoker -- 0.50 packs/day    Types: Cigarettes  . Smokeless tobacco: Never Used  . Alcohol Use: Yes     Comment: social  . Drug Use:  Yes    Special: Marijuana     Comment: heroin  . Sexual Activity: Yes    Birth Control/ Protection: None   Other Topics Concern  . None   Social History Narrative    Past Psychiatric History: Hospitalizations:  Outpatient Care:  Substance Abuse Care:  Self-Mutilation:  Suicidal Attempts:  Violent Behaviors:   Risk to Self: Is patient at risk for suicide?: No What has been your use of drugs/alcohol within the last 12 months?: Pt reports the use of alcohol on occasion.  Pt reports pain pills, and heroin Risk to Others: Endorses Prior Inpatient Therapy: Denies Prior Outpatient Therapy: Endorses  Level of Care:  OP  Hospital Course:   Pt was hospitalized for addiction to heroin and he wanted to stop using heroin so he could be a better father to his daughter and improve his life overall. The pt was started on Subutex while hospitalized but the pt stated he did not want to continue this medication after several dosages. The weekend treating psychiatrist discontinued Subutex and started the pt on symptomatic treatment for opioid withdrawal. The pt was started on gabapentin for cravings and insomnia, clonidine for withdrawal symptoms, flexeril for muscle spasms and trazodone for depression and insomnia. He also was prescribed quetiapine for augmentation of his antidepressant.   The pt tolerated these medications well. He attended groups, was medication compliant and without medication side effects. On 03-22-16, he asked to be restarted on Subutex for withdrawals along with symptomatic control. Since the pt did not want to take this medication as an outpatient, he was started on Subutex  BID/prn. He used this on average once per day for withdrawals with good results.   Pt tolerated medications well. Due to repeated denials of SI, HI, AVH and stabilization of the patient's mood with improvement in his withdrawal symptoms, it was decided by the treatment team on 03/24/2016 to discharge the pt  to his home. He is to follow-up with outpatient rehabilitation as noted in SW notes.    Consults:  None  Significant Diagnostic Studies:   Results for orders placed or performed during the hospital encounter of 03/20/16  Comprehensive metabolic panel  Result Value Ref Range   Sodium 135 135 - 145 mmol/L   Potassium 4.1 3.5 - 5.1 mmol/L   Chloride 102 101 - 111 mmol/L   CO2 29 22 - 32 mmol/L   Glucose, Bld 107 (H) 65 - 99 mg/dL   BUN 15 6 - 20 mg/dL   Creatinine, Ser 1.61 0.61 - 1.24 mg/dL   Calcium 9.2 8.9 - 09.6 mg/dL   Total Protein 7.2 6.5 - 8.1 g/dL   Albumin 4.5 3.5 - 5.0 g/dL   AST 22 15 - 41 U/L   ALT 30 17 - 63 U/L   Alkaline Phosphatase 82 38 - 126 U/L   Total Bilirubin 0.4 0.3 - 1.2 mg/dL   GFR calc non Af Amer >60 >60 mL/min   GFR calc Af Amer >60 >60 mL/min   Anion gap 4 (L) 5 - 15  Ethanol (ETOH)  Result Value Ref Range   Alcohol, Ethyl (B) <5 <5 mg/dL  CBC  Result Value Ref Range   WBC 13.3 (H) 3.8 - 10.6 K/uL   RBC 5.09 4.40 - 5.90 MIL/uL   Hemoglobin 16.4 13.0 - 18.0 g/dL   HCT 16.1 09.6 - 04.5 %   MCV 93.0 80.0 - 100.0 fL   MCH 32.2 26.0 - 34.0 pg   MCHC 34.7 32.0 - 36.0 g/dL   RDW 40.9 81.1 - 91.4 %   Platelets 216 150 - 440 K/uL  Urine Drug Screen, Qualitative ([facility] only)  Result Value Ref Range   Tricyclic, Ur Screen NONE DETECTED NONE DETECTED   Amphetamines, Ur Screen NONE DETECTED NONE DETECTED   MDMA (Ecstasy)Ur Screen NONE DETECTED NONE DETECTED   Cocaine Metabolite,Ur [place] NONE DETECTED NONE DETECTED   Opiate, Ur Screen POSITIVE (A) NONE DETECTED   Phencyclidine (PCP) Ur S NONE DETECTED NONE DETECTED   Cannabinoid 50 Ng, Ur  NONE DETECTED NONE DETECTED   Barbiturates, Ur Screen NONE DETECTED NONE DETECTED   Benzodiazepine, Ur Scrn NONE DETECTED NONE DETECTED   Methadone Scn, Ur NONE DETECTED NONE DETECTED  Acetaminophen level  Result Value Ref Range   Acetaminophen (Tylenol), Serum <10 (L) 10 - 30 ug/mL  Salicylate level  Result Value  Ref Range   Salicylate Lvl <4.0 2.8 - 30.0 mg/dL     Discharge Vitals:   Blood pressure 123/68, pulse 66, temperature 97.6 F (36.4 C), temperature source Oral, resp. rate 18, height 6\' 1"  (1.854 m), weight 80.287 kg (177 lb), SpO2 99 %. Body mass index is 23.36 kg/(m^2). Lab Results:   No results found for this or any previous visit (from the past 72 hour(s)).  Physical Findings: AIMS: 0 CIWA:  N/A COWS:  COWS Total Score: 1   See Psychiatric Specialty Exam and Suicide Risk Assessment completed by Attending Physician prior to discharge.  Discharge destination:  Home  Is patient on multiple antipsychotic therapies at discharge:  No   Has Patient had three or more failed trials of antipsychotic monotherapy by history:  No    Recommended Plan for Multiple Antipsychotic Therapies: NA     Medication List    STOP taking these medications        gabapentin 800 MG tablet  Commonly known as:  NEURONTIN  Replaced by:  gabapentin 100 MG capsule      TAKE these medications      Indication   clindamycin 300 MG capsule  Commonly known as:  CLEOCIN  Take 300 mg by mouth 3 (three) times daily. Reported on 03/20/2016      Fluticasone-Salmeterol 250-50 MCG/DOSE Aepb  Commonly known as:  ADVAIR  Inhale 1 puff into the lungs every 12 (twelve) hours as needed.      gabapentin 100 MG capsule  Commonly known as:  NEURONTIN  Take 1 capsule (100 mg total) by mouth 2 (two) times daily.   Indication:  Cocaine Dependence     gabapentin 300 MG capsule  Commonly known as:  NEURONTIN  Take 2 capsules (600 mg total) by mouth at bedtime.   Indication:  Cocaine Dependence     ibuprofen 800 MG tablet  Commonly known as:  ADVIL,MOTRIN  Take 800 mg by mouth every 8 (eight) hours as needed.      QUEtiapine 50 MG tablet  Commonly known as:  SEROQUEL  Take 3 tablets (150 mg total) by mouth at bedtime.   Indication:  Depression adjunctive treatment  traZODone 100 MG tablet  Commonly  known as:  DESYREL  Take 1 tablet (100 mg total) by mouth at bedtime.   Indication:  Trouble Sleeping, Major Depressive Disorder           Follow-up Information    Please follow up.   Contact information:          Please follow up.   Contact information:          Follow up with [facility].   Why:  Call the office or email them at: [email or web address] to seek admittance for residential substance abuse treatment, after your court date on April 20th, 2017   Contact information:   [address] [place], [place] [address] Ph: [phone] Do not fax;Fax not needed      Follow up with [facility].   Why:  Please arrive to the walk-in clinic at the [facility] to be assessed for inpatient treatment in the residential substance abuse treatment in [place].  The [facility] should contact you within two business days to schedule an appointment if you prefer.   Contact information:   [address] [place] [place], [place] [address] Ph: [phone] ext [phone] Fax: [phone]      Follow-up recommendations: Activity:  no restrictions Diet:  no restrictions Tests:  Please check routine labs for antipsychotic medications Other:  Please check EKG as pt is taking an antipsychotic medication  Comments:  None  Total Discharge Time: >30 MINUTES.  >50 % OF THE TIME WAS SPENT IN COORDINATION OF CARE  Signed: Gena Fray 03/24/2016, 9:02 AM

## 2016-04-02 MED FILL — SUBOXONE 8 MG-2 MG SL FILM: 8-2 | 28 days supply | Qty: 84 | Fill #0

## 2016-04-30 MED FILL — SUBOXONE 8 MG-2 MG SL FILM: 8-2 | 28 days supply | Qty: 84 | Fill #0

## 2016-05-28 MED FILL — SUBOXONE 8 MG-2 MG SL FILM: 8-2 | 28 days supply | Qty: 84 | Fill #0

## 2016-07-14 ENCOUNTER — Emergency Department
Admission: EM | Admit: 2016-07-14 | Discharge: 2016-07-14 | Disposition: A | Discharge status: Home / Self Care | Attending: Emergency Medicine | Admitting: Emergency Medicine

## 2016-07-14 ENCOUNTER — Encounter: Payer: Self-pay | Admitting: Emergency Medicine

## 2016-07-14 DIAGNOSIS — F332 Major depressive disorder, recurrent severe without psychotic features: Secondary | ICD-10-CM | POA: Diagnosis not present

## 2016-07-14 DIAGNOSIS — Z79899 Other long term (current) drug therapy: Secondary | ICD-10-CM | POA: Diagnosis not present

## 2016-07-14 DIAGNOSIS — F17210 Nicotine dependence, cigarettes, uncomplicated: Secondary | ICD-10-CM | POA: Insufficient documentation

## 2016-07-14 DIAGNOSIS — J45909 Unspecified asthma, uncomplicated: Secondary | ICD-10-CM | POA: Insufficient documentation

## 2016-07-14 DIAGNOSIS — T40601A Poisoning by unspecified narcotics, accidental (unintentional), initial encounter: Secondary | ICD-10-CM | POA: Diagnosis present

## 2016-07-14 LAB — CBC
HCT: 48.7 % (ref 40.0–52.0)
HEMOGLOBIN: 17 g/dL (ref 13.0–18.0)
MCH: 32.5 pg (ref 26.0–34.0)
MCHC: 34.8 g/dL (ref 32.0–36.0)
MCV: 93.3 fL (ref 80.0–100.0)
Platelets: 247 10*3/uL (ref 150–440)
RBC: 5.22 MIL/uL (ref 4.40–5.90)
RDW: 14.5 % (ref 11.5–14.5)
WBC: 15.8 10*3/uL — ABNORMAL HIGH (ref 3.8–10.6)

## 2016-07-14 LAB — COMPREHENSIVE METABOLIC PANEL
ALBUMIN: 4.9 g/dL (ref 3.5–5.0)
ALT: 71 U/L — AB (ref 17–63)
ANION GAP: 10 (ref 5–15)
AST: 37 U/L (ref 15–41)
Alkaline Phosphatase: 97 U/L (ref 38–126)
BUN: 18 mg/dL (ref 6–20)
CHLORIDE: 103 mmol/L (ref 101–111)
CO2: 25 mmol/L (ref 22–32)
CREATININE: 1.02 mg/dL (ref 0.61–1.24)
Calcium: 9.2 mg/dL (ref 8.9–10.3)
GFR calc non Af Amer: >60 mL/min (ref >60)
Glucose, Bld: 124 mg/dL — ABNORMAL HIGH (ref 65–99)
Potassium: 3.9 mmol/L (ref 3.5–5.1)
SODIUM: 138 mmol/L (ref 135–145)
Total Bilirubin: 0.7 mg/dL (ref 0.3–1.2)
Total Protein: 7.7 g/dL (ref 6.5–8.1)

## 2016-07-14 LAB — ACETAMINOPHEN LEVEL

## 2016-07-14 LAB — SALICYLATE LEVEL

## 2016-07-14 LAB — ETHANOL: Alcohol, Ethyl (B): <5 mg/dL (ref <5)

## 2016-07-14 NOTE — ED Provider Notes (Signed)
[facility] Emergency Department Provider Note  ____________________________________________   First MD Initiated Contact with Patient 07/14/16 [phone]     (approximate)  I have reviewed the triage vital signs and the nursing notes.   HISTORY  Chief Complaint Drug Overdose    HPI Scott Brock is a 35 y.o. male with history of opiate abuse presents via EMS status post "snorting heroin with altered mental status following. Patient states he just left a 35 day detox program yesterday and found some heroin in his room which he "snorted". Patient states he felt really strong. Patient states he did not lose consciousness but "felt weird". Patient states 2 pieces mother he stated that he would come to the emergency department for evaluation. He stated that while in route EMS "started coming down". Patient states that he now feels fine and is requesting to be discharged. I offered detox patient which he adamantly refused. Patient refusing any further evaluation. Patient denies any suicidal or homicidal ideation   Past Medical History:  Diagnosis Date  . Acid reflux   . Asthma   . Chronic back pain   . Renal disorder   . Status post dilation of esophageal narrowing     Patient Active Problem List   Diagnosis Date Noted  . Major depressive disorder, single episode, severe without psychotic features (HCC) 03/20/2016  . Opioid use disorder, severe, dependence (HCC) 03/20/2016  . Tobacco use disorder 03/20/2016    Past Surgical History:  Procedure Laterality Date  . RHINOPLASTY    . UPPER ENDOSCOPY W/ ESOPHAGEAL MANOMETRY      Prior to Admission medications   Medication Sig Start Date End Date Taking? Authorizing Provider  clindamycin (CLEOCIN) 300 MG capsule Take 300 mg by mouth 3 (three) times daily. Reported on 03/20/2016    Historical Provider, MD  cloNIDine (CATAPRES) 0.1 MG tablet Take 1 tablet (0.1 mg total) by mouth 3 (three) times daily. 03/24/16   Gena Fray, MD  cyclobenzaprine (FLEXERIL) 10 MG tablet Take 1 tablet (10 mg total) by mouth 3 (three) times daily as needed for muscle spasms. 03/24/16   Gena Fray, MD  Fluticasone-Salmeterol (ADVAIR) 250-50 MCG/DOSE AEPB Inhale 1 puff into the lungs every 12 (twelve) hours as needed.     Historical Provider, MD  gabapentin (NEURONTIN) 100 MG capsule Take 1 capsule (100 mg total) by mouth 2 (two) times daily. 03/24/16   Gena Fray, MD  gabapentin (NEURONTIN) 300 MG capsule Take 2 capsules (600 mg total) by mouth at bedtime. 03/24/16   Gena Fray, MD  ibuprofen (ADVIL,MOTRIN) 800 MG tablet Take 800 mg by mouth every 8 (eight) hours as needed.    Historical Provider, MD  QUEtiapine (SEROQUEL) 50 MG tablet Take 3 tablets (150 mg total) by mouth at bedtime. 03/24/16   Gena Fray, MD  traZODone (DESYREL) 100 MG tablet Take 1 tablet (100 mg total) by mouth at bedtime. 03/24/16   Gena Fray, MD    Allergies No known drug allergies  History reviewed. No pertinent family history.  Social History Social History  Substance Use Topics  . Smoking status: Current Every Day Smoker    Packs/day: 0.50    Types: Cigarettes  . Smokeless tobacco: Never Used  . Alcohol use Yes     Comment: social    Review of Systems Constitutional: No fever/chills Eyes: No visual changes. ENT: No sore throat. Cardiovascular: Denies chest pain. Respiratory: Denies shortness of breath. Gastrointestinal: No  abdominal pain.  No nausea, no vomiting.  No diarrhea.  No constipation. Genitourinary: Negative for dysuria. Musculoskeletal: Negative for back pain. Skin: Negative for rash. Neurological: Negative for headaches, focal weakness or numbness.  10-point ROS otherwise negative.  ____________________________________________   PHYSICAL EXAM:  VITAL SIGNS: ED Triage Vitals [07/14/16 0610]  Enc Vitals Group     BP      Pulse      Resp      Temp      Temp src      SpO2 98 %     Weight       Height      Head Circumference      Peak Flow      Pain Score      Pain Loc      Pain Edu?      Excl. in GC?     Constitutional: Alert and oriented. Well appearing and in no acute distress. Eyes: Conjunctivae are normal. PERRL. EOMI. Head: Atraumatic. Ears:  Healthy appearing ear canals and TMs bilaterally Nose: No congestion/rhinnorhea. Mouth/Throat: Mucous membranes are moist.  Oropharynx non-erythematous. Neck: No stridor.  No meningeal signs.  Cardiovascular: Normal rate, regular rhythm. Good peripheral circulation. Grossly normal heart sounds.   Respiratory: Normal respiratory effort.  No retractions. Lungs CTAB. Gastrointestinal: Soft and nontender. No distention.  Musculoskeletal: No lower extremity tenderness nor edema. No gross deformities of extremities. Neurologic:  Normal speech and language. No gross focal neurologic deficits are appreciated.  Skin:  Skin is warm, dry and intact. No rash noted. Psychiatric: Mood and affect are normal. Speech and behavior are normal.  ____________________________________________   LABS (all labs ordered are listed, but only abnormal results are displayed)  Labs Reviewed  CBC - Abnormal; Notable for the following:       Result Value   WBC 15.8 (*)    All other components within normal limits  COMPREHENSIVE METABOLIC PANEL  ETHANOL  SALICYLATE LEVEL  ACETAMINOPHEN LEVEL  URINE DRUG SCREEN, QUALITATIVE ([facility] ONLY)     RADIOLOGY I, What Cheer N BROWN, personally viewed and evaluated these images (plain radiographs) as part of my medical decision making, as well as reviewing the written report by the radiologist.  No results found.    Procedures   ____________________________________________   INITIAL IMPRESSION / ASSESSMENT AND PLAN / ED COURSE  Pertinent labs & imaging results that were available during my care of the patient were reviewed by me and considered in my medical decision making (see chart for  details).  Patient denied any suicidal or homicidal ideation. He is requesting discharge at this time. Patient waiting for his mother to arrive to take him home from the emergency department.  Clinical Course    ____________________________________________  FINAL CLINICAL IMPRESSION(S) / ED DIAGNOSES  Final diagnoses:  Opiate overdose, accidental or unintentional, initial encounter     MEDICATIONS GIVEN DURING THIS VISIT:  Medications - No data to display   NEW OUTPATIENT MEDICATIONS STARTED DURING THIS VISIT:  New Prescriptions   No medications on file      Note:  This document was prepared using Dragon voice recognition software and may include unintentional dictation errors.    Darci Current, MD 07/15/16 [phone]

## 2016-07-14 NOTE — ED Triage Notes (Signed)
Pt arrived from home after EMS states pts mother called for pt to be evaluated post heroine ingestion. EMS reports pt has been in a 35 day program on Tuesday and went home, snorted heroin. Mother demanded pt be brought in to ED for help and evaluation.

## 2016-07-14 NOTE — ED Notes (Signed)
Discharged with mom.

## 2016-07-14 NOTE — ED Notes (Signed)
MD at bedside. 

## 2016-07-22 ENCOUNTER — Emergency Department
Admission: EM | Admit: 2016-07-22 | Discharge: 2016-07-24 | Attending: Emergency Medicine | Admitting: Emergency Medicine

## 2016-07-22 DIAGNOSIS — Z046 Encounter for general psychiatric examination, requested by authority: Secondary | ICD-10-CM | POA: Diagnosis present

## 2016-07-22 DIAGNOSIS — F1020 Alcohol dependence, uncomplicated: Secondary | ICD-10-CM

## 2016-07-22 DIAGNOSIS — F1910 Other psychoactive substance abuse, uncomplicated: Secondary | ICD-10-CM | POA: Insufficient documentation

## 2016-07-22 DIAGNOSIS — F1994 Other psychoactive substance use, unspecified with psychoactive substance-induced mood disorder: Secondary | ICD-10-CM | POA: Diagnosis not present

## 2016-07-22 DIAGNOSIS — R45851 Suicidal ideations: Secondary | ICD-10-CM | POA: Insufficient documentation

## 2016-07-22 DIAGNOSIS — F329 Major depressive disorder, single episode, unspecified: Secondary | ICD-10-CM | POA: Diagnosis not present

## 2016-07-22 DIAGNOSIS — F1190 Opioid use, unspecified, uncomplicated: Secondary | ICD-10-CM | POA: Diagnosis not present

## 2016-07-22 DIAGNOSIS — J45909 Unspecified asthma, uncomplicated: Secondary | ICD-10-CM | POA: Insufficient documentation

## 2016-07-22 DIAGNOSIS — Z791 Long term (current) use of non-steroidal anti-inflammatories (NSAID): Secondary | ICD-10-CM | POA: Insufficient documentation

## 2016-07-22 DIAGNOSIS — Z79899 Other long term (current) drug therapy: Secondary | ICD-10-CM | POA: Diagnosis not present

## 2016-07-22 DIAGNOSIS — F1120 Opioid dependence, uncomplicated: Secondary | ICD-10-CM | POA: Diagnosis present

## 2016-07-22 DIAGNOSIS — F17210 Nicotine dependence, cigarettes, uncomplicated: Secondary | ICD-10-CM | POA: Diagnosis not present

## 2016-07-22 DIAGNOSIS — F1290 Cannabis use, unspecified, uncomplicated: Secondary | ICD-10-CM | POA: Insufficient documentation

## 2016-07-22 DIAGNOSIS — F32A Depression, unspecified: Secondary | ICD-10-CM

## 2016-07-22 LAB — CBC
HEMATOCRIT: 45.4 % (ref 40.0–52.0)
HEMOGLOBIN: 15.9 g/dL (ref 13.0–18.0)
MCH: 33.1 pg (ref 26.0–34.0)
MCHC: 35 g/dL (ref 32.0–36.0)
MCV: 94.5 fL (ref 80.0–100.0)
Platelets: 181 10*3/uL (ref 150–440)
RBC: 4.8 MIL/uL (ref 4.40–5.90)
RDW: 13.8 % (ref 11.5–14.5)
WBC: 6.9 10*3/uL (ref 3.8–10.6)

## 2016-07-22 LAB — COMPREHENSIVE METABOLIC PANEL
ALBUMIN: 3.7 g/dL (ref 3.5–5.0)
ALT: 56 U/L (ref 17–63)
ANION GAP: 6 (ref 5–15)
AST: 73 U/L — AB (ref 15–41)
Alkaline Phosphatase: 76 U/L (ref 38–126)
BUN: 9 mg/dL (ref 6–20)
CHLORIDE: 107 mmol/L (ref 101–111)
CO2: 25 mmol/L (ref 22–32)
Calcium: 8.8 mg/dL — ABNORMAL LOW (ref 8.9–10.3)
Creatinine, Ser: 0.91 mg/dL (ref 0.61–1.24)
GFR calc Af Amer: >60 mL/min (ref >60)
GFR calc non Af Amer: >60 mL/min (ref >60)
GLUCOSE: 144 mg/dL — AB (ref 65–99)
Potassium: 4 mmol/L (ref 3.5–5.1)
Sodium: 138 mmol/L (ref 135–145)
Total Bilirubin: 0.4 mg/dL (ref 0.3–1.2)
Total Protein: 6.2 g/dL — ABNORMAL LOW (ref 6.5–8.1)

## 2016-07-22 LAB — ETHANOL

## 2016-07-22 LAB — URINE DRUG SCREEN, QUALITATIVE (ARMC ONLY)
Amphetamines, Ur Screen: NOT DETECTED
BARBITURATES, UR SCREEN: NOT DETECTED
Benzodiazepine, Ur Scrn: POSITIVE — AB
COCAINE METABOLITE, UR ~~LOC~~: NOT DETECTED
Cannabinoid 50 Ng, Ur ~~LOC~~: POSITIVE — AB
MDMA (ECSTASY) UR SCREEN: NOT DETECTED
METHADONE SCREEN, URINE: NOT DETECTED
OPIATE, UR SCREEN: POSITIVE — AB
Phencyclidine (PCP) Ur S: NOT DETECTED
Tricyclic, Ur Screen: NOT DETECTED

## 2016-07-22 LAB — ACETAMINOPHEN LEVEL: Acetaminophen (Tylenol), Serum: <10 ug/mL — ABNORMAL LOW (ref 10–30)

## 2016-07-22 LAB — SALICYLATE LEVEL

## 2016-07-22 MED ORDER — LORAZEPAM 2 MG PO TABS: 0.0000 mg | ORAL_TABLET | Freq: Two times a day (BID) | ORAL | Status: DC

## 2016-07-22 MED ORDER — LORAZEPAM 2 MG PO TABS
0.0000 mg | ORAL_TABLET | Freq: Four times a day (QID) | ORAL | Status: DC
  Administered 2016-07-23: 2 mg via ORAL
  Administered 2016-07-23 – 2016-07-24 (×2): 1 mg via ORAL
  Filled 2016-07-22: qty 1

## 2016-07-22 MED ORDER — BUPRENORPHINE HCL 8 MG SL SUBL
8.0000 mg | SUBLINGUAL_TABLET | SUBLINGUAL | Status: AC
  Administered 2016-07-22: 8 mg via SUBLINGUAL

## 2016-07-22 MED ORDER — CLINDAMYCIN HCL 150 MG PO CAPS
300.0000 mg | ORAL_CAPSULE | Freq: Three times a day (TID) | ORAL | Status: DC
  Administered 2016-07-22 – 2016-07-24 (×6): 300 mg via ORAL
  Filled 2016-07-22: qty 1
  Filled 2016-07-22 (×2): qty 2
  Filled 2016-07-22: qty 1
  Filled 2016-07-22 (×5): qty 2

## 2016-07-22 NOTE — ED Notes (Signed)
Snack and beverage given. 

## 2016-07-22 NOTE — ED Triage Notes (Signed)
Pt is here voluntary with his mother, states he is having Suicidal thoughts, states he is using heroin and drinks ETOH,  States last used last night.. Was recently in a rehab center..Marland Kitchen

## 2016-07-22 NOTE — ED Notes (Signed)
Pt. Transferred to B[facility] from ED to room 3. Report to include Situation, Background, Assessment and Recommendations from RN. Pt. Oriented to unit including Q15 minute rounds as well as the security cameras for their protection. Patient is alert and oriented, warm and dry in no acute distress. Patient denies SI, HI, and AVH. Pt. Encouraged to let me know if needs arise.

## 2016-07-22 NOTE — ED Notes (Signed)
Patient noted sleeping in room. No complaints, stable, in no acute distress. Q15 minute rounds and monitoring via Security Cameras to continue.  
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
## 2016-07-22 NOTE — ED Provider Notes (Signed)
[facility] Emergency Department Provider Note  ____________________________________________   I have reviewed the triage vital signs and the nursing notes.   HISTORY  Chief Complaint Suicidal and Medical Clearance    HPI Scott Brock is a 35 y.o. male presents today complaining of feeling suicidal. Patient does drink regularly, last echo this morning. Also uses heroin but only nasally. Denies IV drug use. Has thoughts of hurting himself. Does not taken an overdose. Has no other complaint.      Past Medical History:  Diagnosis Date  . Acid reflux   . Asthma   . Chronic back pain   . Renal disorder   . Status post dilation of esophageal narrowing     Patient Active Problem List   Diagnosis Date Noted  . Substance induced mood disorder ([facility]) 07/22/2016  . Alcohol abuse 07/22/2016  . Involuntary commitment 07/22/2016  . Major depressive disorder, single episode, severe without psychotic features ([facility]) 03/20/2016  . Opioid use disorder, severe, dependence ([facility]) 03/20/2016  . Tobacco use disorder 03/20/2016    Past Surgical History:  Procedure Laterality Date  . APPENDECTOMY    . RHINOPLASTY    . UPPER ENDOSCOPY W/ ESOPHAGEAL MANOMETRY      Current Outpatient Rx  . Order #: [id number] Class: Historical Med  . Order #: [id number] Class: Print  . Order #: [id number] Class: Print  . Order #: [id number] Class: Historical Med  . Order #: [id number] Class: Print  . Order #: [id number] Class: Print  . Order #: [id number] Class: Historical Med  . Order #: [id number] Class: Print  . Order #: [id number] Class: Print    Allergies Review of patient's allergies indicates no known allergies.  No family history on file.  Social History Social History  Substance Use Topics  . Smoking status: Current Every Day Smoker    Packs/day: 0.50    Types: Cigarettes  . Smokeless tobacco: Never Used  . Alcohol use Yes     Comment: social    Review of  Systems Constitutional: No fever/chills Eyes: No visual changes. ENT: No sore throat. No stiff neck no neck pain Cardiovascular: Denies chest pain. Respiratory: Denies shortness of breath. Gastrointestinal:   no vomiting.  No diarrhea.  No constipation. Genitourinary: Negative for dysuria. Musculoskeletal: Negative lower extremity swelling Skin: Negative for rash. Neurological: Negative for severe headaches, focal weakness or numbness. 10-point ROS otherwise negative.  ____________________________________________   PHYSICAL EXAM:  VITAL SIGNS: ED Triage Vitals  Enc Vitals Group     BP 07/22/16 1644 (!) 122/59     Pulse Rate 07/22/16 1644 99     Resp 07/22/16 1644 17     Temp 07/22/16 1644 98.5 F (36.9 C)     Temp Source 07/22/16 1644 Oral     SpO2 07/22/16 1644 96 %     Weight 07/22/16 1644 180 lb (81.6 kg)     Height 07/22/16 1644  (1.854 m)     Head Circumference --      Peak Flow --      Pain Score 07/22/16 1645 3     Pain Loc --      Pain Edu? --      Excl. in GC? --     Constitutional: Alert and oriented. Well appearing and in no acute distress. Eyes: Conjunctivae are normal. PERRL. EOMI. Head: Atraumatic. Nose: No congestion/rhinnorhea. Mouth/Throat: Mucous membranes are moist.  Oropharynx non-erythematous. Neck: No stridor.   Nontender with no meningismus Cardiovascular: Normal rate, regular rhythm. Grossly normal heart sounds.  Good peripheral circulation. Respiratory: Normal respiratory effort.  No retractions. Lungs CTAB. Abdominal: Soft and nontender. No distention. No guarding no rebound Back:  There is no focal tenderness or step off.  there is no midline tenderness there are no lesions noted. there is no CVA tenderness Musculoskeletal: No lower extremity tenderness, no upper extremity tenderness. No joint effusions, no DVT signs strong distal pulses no edema Neurologic:  Normal speech and language. No gross focal neurologic deficits are  appreciated.  Skin:  Skin is warm, dry and intact. No rash noted.Appears to have been picking at his skin on his face but no evidence of infection no track marks noted Psychiatric: Mood and affect are normal. Speech and behavior are normal.  ____________________________________________   LABS (all labs ordered are listed, but only abnormal results are displayed)  Labs Reviewed  COMPREHENSIVE METABOLIC PANEL - Abnormal; Notable for the following:       Result Value   Glucose, Bld 144 (*)    Calcium 8.8 (*)    Total Protein 6.2 (*)    AST 73 (*)    All other components within normal limits  ETHANOL  CBC  URINE DRUG SCREEN, QUALITATIVE ([facility] ONLY)   ____________________________________________  EKG  I personally interpreted any EKGs ordered by me or triage  ____________________________________________  RADIOLOGY  I reviewed any imaging ordered by me or triage that were performed during my shift and, if possible, patient and/or family made aware of any abnormal findings. ____________________________________________   PROCEDURES  Procedure(s) performed: None  Procedures  Critical Care performed: None  ____________________________________________   INITIAL IMPRESSION / ASSESSMENT AND PLAN / ED COURSE  Pertinent labs & imaging results that were available during my care of the patient were reviewed by me and considered in my medical decision making (see chart for details).  Patient here for suicidal thoughts and drug abuse. No murmur, no fever. Denies overdose.  Clinical Course   ____________________________________________   FINAL CLINICAL IMPRESSION(S) / ED DIAGNOSES  Final diagnoses:  None      This chart was dictated using voice recognition software.  Despite best efforts to proofread,  errors can occur which can change meaning.      Jeanmarie PlantJames A Annelies Coyt, MD 07/22/16 [phone]

## 2016-07-22 NOTE — BH Assessment (Signed)
Assessment Note  Scott Brock is an 35 y.o. male presenting to the ED under IVC, initiated by his mother.   Patient reports he is very depressed. He says he is having active thoughts about wanting to die and had thought about shooting himself.   Patient uses  IV heroin on a daily basis and last used on yesterday. He also says that he is drinking heavily as last use of alcohol was this morning. Pt denies any visual/auditory hallucinations.  Diagnosis: Alcohol abuse  Past Medical History:  Past Medical History:  Diagnosis Date  . Acid reflux   . Asthma   . Chronic back pain   . Renal disorder   . Status post dilation of esophageal narrowing     Past Surgical History:  Procedure Laterality Date  . APPENDECTOMY    . RHINOPLASTY    . UPPER ENDOSCOPY W/ ESOPHAGEAL MANOMETRY      Family History: No family history on file.  Social History:  reports that he has been smoking Cigarettes.  He has been smoking about 0.50 packs per day. He has never used smokeless tobacco. He reports that he drinks alcohol. He reports that he uses drugs, including Marijuana and IV.  Additional Social History:  Alcohol / Drug Use History of alcohol / drug use?: Yes Negative Consequences of Use: Work / Programmer, multimedia, Surveyor, quantity, Personal relationships  CIWA: CIWA-Ar BP: 122/63 Pulse Rate: 79 Nausea and Vomiting: no nausea and no vomiting Tactile Disturbances: none Tremor: no tremor Auditory Disturbances: not present Paroxysmal Sweats: no sweat visible Visual Disturbances: not present Anxiety: no anxiety, at ease Headache, Fullness in Head: none present Agitation: normal activity Orientation and Clouding of Sensorium: oriented and can do serial additions CIWA-Ar Total: 0 COWS:    Allergies: No Known Allergies  Home Medications:  (Not in a hospital admission)  OB/GYN Status:  No LMP for male patient.  General Assessment Data Location of Assessment: [facility] ED TTS Assessment: In system Is this a Tele or  Face-to-Face Assessment?: Face-to-Face Is this an Initial Assessment or a Re-assessment for this encounter?: Initial Assessment Marital status: Single Maiden name: n/a Is patient pregnant?: No Pregnancy Status: No Living Arrangements: Parent Can pt return to current living arrangement?: Yes Admission Status: Involuntary Is patient capable of signing voluntary admission?: Yes Referral Source: Self/Family/Friend Insurance type: Tricare     Crisis Care Plan Living Arrangements: Parent Legal Guardian: Other: (self) Name of Psychiatrist: n/a Name of Therapist: n/a  Education Status Is patient currently in school?: No Current Grade: n/a Highest grade of school patient has completed: n/a Name of school: n/a Contact person: n/a  Risk to self with the past 6 months Suicidal Ideation: Yes-Currently Present Has patient been a risk to self within the past 6 months prior to admission? : No Suicidal Intent: No Has patient had any suicidal intent within the past 6 months prior to admission? : No Is patient at risk for suicide?: Yes Suicidal Plan?: No Has patient had any suicidal plan within the past 6 months prior to admission? : No Access to Means: Yes Specify Access to Suicidal Means: Patient has access to drugs What has been your use of drugs/alcohol within the last 12 months?: heroin, alcohol Previous Attempts/Gestures: No Other Self Harm Risks: None reported Triggers for Past Attempts: None known Intentional Self Injurious Behavior: None Family Suicide History: No Recent stressful life event(s): Job Loss, Financial Problems, Other (Comment) Persecutory voices/beliefs?: No Depression: Yes Depression Symptoms: Despondent, Isolating, Fatigue, Loss of interest in  usual pleasures, Feeling worthless/self pity Substance abuse history and/or treatment for substance abuse?: Yes Suicide prevention information given to non-admitted patients: Not applicable  Risk to Others within the  past 6 months Homicidal Ideation: No Does patient have any lifetime risk of violence toward others beyond the six months prior to admission? : No Thoughts of Harm to Others: No Current Homicidal Intent: No Current Homicidal Plan: No Access to Homicidal Means: No Identified Victim: None identified History of harm to others?: No Assessment of Violence: None Noted Violent Behavior Description: None identified Does patient have access to weapons?: No Criminal Charges Pending?: No Does patient have a court date: No Is patient on probation?: No  Psychosis Hallucinations: None noted Delusions: None noted  Mental Status Report Appearance/Hygiene: In scrubs Eye Contact: Poor Motor Activity: Unremarkable Speech: Incoherent Level of Consciousness: Sedated Mood: Depressed Affect: Flat Anxiety Level: Minimal Thought Processes: Flight of Ideas Judgement: Partial Orientation: Person, Place, Time, Situation Obsessive Compulsive Thoughts/Behaviors: None  Cognitive Functioning Concentration: Poor Memory: Recent Impaired, Remote Impaired IQ: Average Insight: Fair Impulse Control: Fair Appetite: Fair Weight Loss: 0 Weight Gain: 0 Sleep: No Change Vegetative Symptoms: None  ADLScreening [facility] Assessment Services) Patient's cognitive ability adequate to safely complete daily activities?: Yes Patient able to express need for assistance with ADLs?: Yes Independently performs ADLs?: Yes (appropriate for developmental age)  Prior Inpatient Therapy Prior Inpatient Therapy: No Prior Therapy Dates: n/a Prior Therapy Facilty/Provider(s): n/a Reason for Treatment: n/a  Prior Outpatient Therapy Prior Outpatient Therapy: No Prior Therapy Dates: n/a Prior Therapy Facilty/Provider(s): n/a Reason for Treatment: n/a Does patient have an ACCT team?: No Does patient have Intensive In-House Services?  : No Does patient have [facility] services? : No Does patient have P4CC services?: No  ADL  Screening (condition at time of admission) Patient's cognitive ability adequate to safely complete daily activities?: Yes Patient able to express need for assistance with ADLs?: Yes Independently performs ADLs?: Yes (appropriate for developmental age)       Abuse/Neglect Assessment (Assessment to be complete while patient is alone) Physical Abuse: Denies Verbal Abuse: Denies Sexual Abuse: Denies Exploitation of patient/patient's resources: Denies Self-Neglect: Denies Values / Beliefs Cultural Requests During Hospitalization: None Spiritual Requests During Hospitalization: None Consults Spiritual Care Consult Needed: No Social Work Consult Needed: No Merchant navy officerAdvance Directives (For Healthcare) Does patient have an advance directive?: No Would patient like information on creating an advanced directive?: No - patient declined information    Additional Information 1:1 In Past 12 Months?: No CIRT Risk: No Elopement Risk: No Does patient have medical clearance?: Yes     Disposition:  Disposition Initial Assessment Completed for this Encounter: Yes Disposition of Patient: Other dispositions Other disposition(s): Other (Comment) (Pending Psych MD consult)  On Site Evaluation by:   Reviewed with Physician:    Artist Beachoxana C Dmitriy Gair 07/22/2016 11:54 PM

## 2016-07-23 MED ORDER — LORAZEPAM 1 MG PO TABS
ORAL_TABLET | ORAL | Status: AC
  Administered 2016-07-23: 1 mg via ORAL
  Filled 2016-07-23: qty 1

## 2016-07-23 NOTE — ED Notes (Signed)
Patient asleep in room. No noted distress or abnormal behavior. Will continue 15 minute checks and observation by security cameras for safety. 

## 2016-07-23 NOTE — ED Notes (Signed)
Patient noted sleeping in room. No complaints, stable, in no acute distress. Q15 minute rounds and monitoring via Security Cameras to continue.  

## 2016-07-23 NOTE — ED Notes (Signed)
ENVIRONMENTAL ASSESSMENT Potentially harmful objects out of patient reach: Yes Personal belongings secured: Yes Patient dressed in hospital provided attire only: Yes Plastic bags out of patient reach: Yes Patient care equipment (cords, cables, call bells, lines, and drains) shortened, removed, or accounted for: Yes Equipment and supplies removed from bottom of stretcher: Yes Potentially toxic materials out of patient reach: Yes Sharps container removed or out of patient reach: Yes  Patient currently in room sleeping. No signs of distress noted at this time. Maintained on 15 minute checks and observation by security camera for safety.  

## 2016-07-23 NOTE — Progress Notes (Signed)
Per Dr. Toni Amendlapacs, recommend OBS treatment, Pt refused OBS, Dr. Toni Amendlapacs recommend d/c does not meet inpatient criteria. Scott Brock, LCAS-A, LPC-A, [facility]  Counselor 07/23/2016 3:32 PM

## 2016-07-23 NOTE — ED Notes (Signed)
Patient resting quietly in room. No noted distress or abnormal behaviors noted. Will continue 15 minute checks and observation by security camera for safety. 

## 2016-07-23 NOTE — ED Notes (Signed)
ED BHU PLACEMENT JUSTIFICATION Is the patient under IVC or is there intent for IVC: Yes.   Is the patient medically cleared: Yes.   Is there vacancy in the ED BHU: Yes.   Is the population mix appropriate for patient: Yes.   Is the patient awaiting placement in inpatient or outpatient setting: Yes.   Has the patient had a psychiatric consult: Yes.   Survey of unit performed for contraband, proper placement and condition of furniture, tampering with fixtures in bathroom, shower, and each patient room: Yes.   APPEARANCE/BEHAVIOR calm and adequate rapport can be established NEURO ASSESSMENT Orientation: time, place and person Hallucinations: No.None noted (Hallucinations) Speech: Normal Gait: normal RESPIRATORY ASSESSMENT Normal expansion.  Clear to auscultation.  No rales, rhonchi, or wheezing. CARDIOVASCULAR ASSESSMENT regular rate and rhythm, S1, S2 normal, no murmur, click, rub or gallop GASTROINTESTINAL ASSESSMENT soft, nontender, BS WNL, no r/g EXTREMITIES normal strength, tone, and muscle mass PLAN OF CARE Provide calm/safe environment. Vital signs assessed twice daily. ED BHU Assessment once each 12-hour shift. Collaborate with intake RN daily or as condition indicates. Assure the ED provider has rounded once each shift. Provide and encourage hygiene. Provide redirection as needed. Assess for escalating behavior; address immediately and inform ED provider.  Assess family dynamic and appropriateness for visitation as needed: Yes.   Educate the patient/family about BHU procedures/visitation: Yes.

## 2016-07-23 NOTE — ED Notes (Signed)
Report was received from Karena T., RN; Scott Brock. Verbalizes no complaints or distress; denies S.I./Hi. Continue to monitor with 15 min. Monitoring. 

## 2016-07-23 NOTE — ED Notes (Signed)
Patient mother San JettyMelanie Vrba ([phone]) called asking to speak with Dr. Toni Amendlapacs concerning the patient's possible discharge. Mother stated that she didn't want patient to be discharged because he was still suicidal. Nursing staff stated that she would pass along message.

## 2016-07-23 NOTE — ED Notes (Signed)
Patient currently in room speaking with psychiatrist. No signs of acute distress noted at this time. Maintained on 15 minute checks and observation by security camera for safety.

## 2016-07-23 NOTE — ED Notes (Signed)
Patient currently "Is not sure" whether he has suicidal ideation at this time. Patient asked if he had a plan and he responded, "I don't know, I think I did". Patient does contract for safety while in the hospital. Patient denies HI and AVH. Patient endorses a headache rated a 3/10 but does not want medical intervention at this time. Patient is sad and depressed and has a flat affect. Patient is pleasant but guarded during the assessment. Patient does request inpatient admission and offered that he is unsure whether or not he can return to his mother's house at this time. Will continue to monitor. Maintained on 15 minute checks and observation by security camera for safety.
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
## 2016-07-23 NOTE — ED Notes (Signed)
Patient refused vital signs x 3 attempts.This RN advised patient of the importance of the vitals per MD order but patient continued to refuse. Will continue to make attempts.

## 2016-07-23 NOTE — ED Notes (Signed)
Patient noted sleeping  room. No complaints, stable, in no acute distress. Q15 minute rounds and monitoring via Security Cameras to continue. 

## 2016-07-24 MED ORDER — LORAZEPAM 1 MG PO TABS
ORAL_TABLET | ORAL | Status: AC
Start: 2016-07-24 — End: 2016-07-24
  Administered 2016-07-24: 1 mg via ORAL
  Filled 2016-07-24: qty 1

## 2016-07-24 NOTE — ED Notes (Signed)
Patient asleep in room. No noted distress or abnormal behavior. Will continue 15 minute checks and observation by security cameras for safety. 

## 2016-07-24 NOTE — ED Notes (Signed)
Patient currently denies SI/HI/AVH and pain. [facility], including a gray and black backpack with various items of clothing. [facility] [employer] [facility]. Admission discussed, patient agreeable with plan.

## 2016-07-24 NOTE — ED Notes (Signed)
Patient is currently in dayroom watching television. No signs of distress noted. Maintained on 15 minute checks and observation by security camera for safety.

## 2016-07-24 NOTE — ED Notes (Signed)
Patient came to nurse's station stating that he felt restless and anxious and that he wanted more ativan. Nurse advised him that he would not be able to have more until approximately 1600 and patient compiled. Will continue to monitor for am increase in symptom severity. Maintained on 15 minute checks and observation by security camera for safety.

## 2016-07-24 NOTE — ED Provider Notes (Signed)
-----------------------------------------   1:25 PM on 07/24/2016 -----------------------------------------  [facility]. I will arrange transfer for the patient.   Minna AntisKevin Austine Wiedeman, MD 07/24/16 1325

## 2016-07-24 NOTE — BH Assessment (Signed)
Referral information for Psychiatric Inpatient Treatment have been faxed to;    [facility] [phone]) [phone]   [facility] [phone]),    [facility] [phone] or [phone]),    [facility] [phone]),    [facility] [phone])   [facility] [phone]),    [facility] [phone]),    [facility] [phone]).

## 2016-07-24 NOTE — BH Assessment (Signed)
Patient has been accepted to [facility].  [facility] Accepting physician is Dr. Robet Leuhotakura.  Call report to [phone] or [phone].  Representative was [employer].  ER Staff is aware of it Christen Bame(Ronnie, ER Sect.; Dr. Lenard LancePaduchowski, ER MD & Kasandra KnudsenKarena, Patient's Nurse).

## 2016-07-24 NOTE — ED Notes (Signed)
ENVIRONMENTAL ASSESSMENT Potentially harmful objects out of patient reach: Yes Personal belongings secured: Yes Patient dressed in hospital provided attire only: Yes Plastic bags out of patient reach: Yes Patient care equipment (cords, cables, call bells, lines, and drains) shortened, removed, or accounted for: Yes Equipment and supplies removed from bottom of stretcher: Yes Potentially toxic materials out of patient reach: Yes Sharps container removed or out of patient reach: Yes  Patient currently in room sleeping. No signs of distress noted. Maintained on 15 minute checks and observation by security camera for safety.  

## 2016-07-24 NOTE — ED Notes (Signed)
Attempt[facility] to [facility] x[facility]. No answer at this time and no opportunity to leave a voicemail. Will continue to try to reach facility by phone.

## 2016-07-24 NOTE — BH Assessment (Signed)
Updated IVC faxed to [facility] [phone], Genevie CheshireShantae) and confirmed it was received.

## 2016-07-24 NOTE — ED Notes (Signed)
Patient called mother and notified her of impending pickup [facility]. Mother had no further questions for nursing staff at this time.

## 2016-07-24 NOTE — ED Notes (Signed)
911 Dispatch called stating that they will pick up the patient at  approximately 1800 or later to [facility].

## 2016-07-24 NOTE — ED Notes (Signed)
CIWA 7.

## 2016-07-24 NOTE — ED Notes (Signed)
Patient is dayroom watching television. Patient given brochure about [facility]. Nurse answered all patient questions and patient voiced agreement in going to the facility. Will continue to monitor. Maintained on 15 minute checks and observation by security camera for safety.

## 2016-07-24 NOTE — ED Notes (Signed)
Patient resting quietly in room. No noted distress or abnormal behaviors noted. Will continue 15 minute checks and observation by security camera for safety. 

## 2016-07-24 NOTE — ED Provider Notes (Signed)
-----------------------------------------   5:30 AM on 07/24/2016 -----------------------------------------   Blood pressure 118/67, pulse 71, temperature 98.2 F (36.8 C), temperature source Oral, resp. rate 16, height 6\' 1"  (1.854 m), weight 180 lb (81.6 kg), SpO2 98 %.  The patient had no acute events since last update.  Calm and cooperative at this time.  Disposition is pending per Psychiatry/Behavioral Medicine team recommendations.     Jeanmarie PlantJames A Ozell Juhasz, MD 07/24/16 [phone]

## 2016-07-24 NOTE — ED Notes (Signed)
Patient has vague SI stating , "I don't know" when asked if he had a plan or if he had suicidal ideation. Patient denies HI and AVH. Patient endorsed a headache rated a 6/10 which was treated with ativan per CIWA protocol.  Patient remains guarded with blunted affect. Continues to express desire to be treated on the inpatient psychiatry unit. Will continue to monitor. Maintained on 15 minute checks and observation by security camera for safety.

## 2016-08-10 MED FILL — oxyCODONE HCL 10 MG TABS: 10 | 5 days supply | Qty: 30 | Fill #0

## 2016-08-10 MED FILL — GABAPENTIN 300 MG CAPSULE: 300 | 30 days supply | Qty: 90 | Fill #0

## 2016-08-15 ENCOUNTER — Emergency Department (HOSPITAL_COMMUNITY)
Admission: EM | Admit: 2016-08-15 | Discharge: 2016-08-16 | Attending: Emergency Medicine | Admitting: Emergency Medicine

## 2016-08-15 DIAGNOSIS — Z79899 Other long term (current) drug therapy: Secondary | ICD-10-CM | POA: Insufficient documentation

## 2016-08-15 DIAGNOSIS — F322 Major depressive disorder, single episode, severe without psychotic features: Secondary | ICD-10-CM | POA: Diagnosis not present

## 2016-08-15 DIAGNOSIS — R45851 Suicidal ideations: Secondary | ICD-10-CM | POA: Diagnosis not present

## 2016-08-15 DIAGNOSIS — F17210 Nicotine dependence, cigarettes, uncomplicated: Secondary | ICD-10-CM | POA: Diagnosis not present

## 2016-08-15 DIAGNOSIS — F1910 Other psychoactive substance abuse, uncomplicated: Secondary | ICD-10-CM

## 2016-08-15 DIAGNOSIS — J45909 Unspecified asthma, uncomplicated: Secondary | ICD-10-CM | POA: Insufficient documentation

## 2016-08-15 DIAGNOSIS — F1120 Opioid dependence, uncomplicated: Secondary | ICD-10-CM | POA: Diagnosis present

## 2016-08-15 DIAGNOSIS — Z046 Encounter for general psychiatric examination, requested by authority: Secondary | ICD-10-CM | POA: Diagnosis present

## 2016-08-15 LAB — COMPREHENSIVE METABOLIC PANEL
ALBUMIN: 4.6 g/dL (ref 3.5–5.0)
ALT: 29 U/L (ref 17–63)
AST: 23 U/L (ref 15–41)
Alkaline Phosphatase: 91 U/L (ref 38–126)
Anion gap: 7 (ref 5–15)
BUN: 11 mg/dL (ref 6–20)
CHLORIDE: 108 mmol/L (ref 101–111)
CO2: 24 mmol/L (ref 22–32)
CREATININE: 1 mg/dL (ref 0.61–1.24)
Calcium: 8.9 mg/dL (ref 8.9–10.3)
GFR calc Af Amer: >60 mL/min (ref >60)
GFR calc non Af Amer: >60 mL/min (ref >60)
GLUCOSE: 90 mg/dL (ref 65–99)
Potassium: 4.2 mmol/L (ref 3.5–5.1)
SODIUM: 139 mmol/L (ref 135–145)
Total Bilirubin: 0.5 mg/dL (ref 0.3–1.2)
Total Protein: 6.9 g/dL (ref 6.5–8.1)

## 2016-08-15 LAB — CBC
HCT: 44 % (ref 39.0–52.0)
HEMOGLOBIN: 15.4 g/dL (ref 13.0–17.0)
MCH: 31.8 pg (ref 26.0–34.0)
MCHC: 35 g/dL (ref 30.0–36.0)
MCV: 90.7 fL (ref 78.0–100.0)
Platelets: 237 10*3/uL (ref 150–400)
RBC: 4.85 MIL/uL (ref 4.22–5.81)
RDW: 12.6 % (ref 11.5–15.5)
WBC: 12.8 10*3/uL — ABNORMAL HIGH (ref 4.0–10.5)

## 2016-08-15 LAB — SALICYLATE LEVEL

## 2016-08-15 LAB — RAPID URINE DRUG SCREEN, HOSP PERFORMED
AMPHETAMINES: POSITIVE — AB
Barbiturates: NOT DETECTED
Benzodiazepines: POSITIVE — AB
Cocaine: NOT DETECTED
OPIATES: POSITIVE — AB
TETRAHYDROCANNABINOL: NOT DETECTED

## 2016-08-15 LAB — ACETAMINOPHEN LEVEL: Acetaminophen (Tylenol), Serum: <10 ug/mL — ABNORMAL LOW (ref 10–30)

## 2016-08-15 LAB — ETHANOL: Alcohol, Ethyl (B): <5 mg/dL (ref <5)

## 2016-08-15 MED ORDER — ONDANSETRON 4 MG PO TBDP: 4.0000 mg | ORAL_TABLET | Freq: Four times a day (QID) | ORAL | Status: DC | PRN

## 2016-08-15 MED ORDER — CLONIDINE HCL 0.1 MG PO TABS: 0.1000 mg | ORAL_TABLET | ORAL | Status: DC

## 2016-08-15 MED ORDER — CLONIDINE HCL 0.1 MG PO TABS: 0.1000 mg | ORAL_TABLET | Freq: Every day | ORAL | Status: DC

## 2016-08-15 MED ORDER — NAPROXEN 500 MG PO TABS: 500.0000 mg | ORAL_TABLET | Freq: Two times a day (BID) | ORAL | Status: DC | PRN

## 2016-08-15 MED ORDER — HYDROXYZINE HCL 25 MG PO TABS: 25.0000 mg | ORAL_TABLET | Freq: Four times a day (QID) | ORAL | Status: DC | PRN

## 2016-08-15 MED ORDER — METHOCARBAMOL 500 MG PO TABS: 500.0000 mg | ORAL_TABLET | Freq: Three times a day (TID) | ORAL | Status: DC | PRN

## 2016-08-15 MED ORDER — LOPERAMIDE HCL 2 MG PO CAPS: 2.0000 mg | ORAL_CAPSULE | ORAL | Status: DC | PRN

## 2016-08-15 MED ORDER — DICYCLOMINE HCL 20 MG PO TABS: 20.0000 mg | ORAL_TABLET | Freq: Four times a day (QID) | ORAL | Status: DC | PRN

## 2016-08-15 MED ORDER — CLONIDINE HCL 0.1 MG PO TABS
0.1000 mg | ORAL_TABLET | Freq: Four times a day (QID) | ORAL | Status: DC
  Administered 2016-08-15 – 2016-08-16 (×3): 0.1 mg via ORAL
  Filled 2016-08-15 (×3): qty 1

## 2016-08-15 NOTE — ED Provider Notes (Signed)
[facility] Provider Note   CSN: [id number] Arrival date & time: 08/15/16  1655     History   Chief Complaint Chief Complaint  Patient presents with  . IVC    HPI Scott Brock is a 35 y.o. male.  The history is provided by the patient.  Patient presents after being IVC by father for substance abuse, belligerent behavior, and threat to himself with SI. Patient currently denying any SI, HI, ADH. Reports that his never had any suicidal ideations or prior times. Patient does endorse heroine use. Patient currently has no other physical complaints.   Past Medical History:  Diagnosis Date  . Acid reflux   . Asthma   . Chronic back pain   . Renal disorder   . Status post dilation of esophageal narrowing     Patient Active Problem List   Diagnosis Date Noted  . Substance induced mood disorder (HCC) 07/22/2016  . Alcohol abuse 07/22/2016  . Involuntary commitment 07/22/2016  . Major depressive disorder, single episode, severe without psychotic features (HCC) 03/20/2016  . Opioid use disorder, severe, dependence (HCC) 03/20/2016  . Tobacco use disorder 03/20/2016    Past Surgical History:  Procedure Laterality Date  . APPENDECTOMY    . RHINOPLASTY    . UPPER ENDOSCOPY W/ ESOPHAGEAL MANOMETRY         Home Medications    Prior to Admission medications   Medication Sig Start Date End Date Taking? Authorizing Provider  Fluticasone-Salmeterol (ADVAIR) 250-50 MCG/DOSE AEPB Inhale 1 puff into the lungs every 12 (twelve) hours as needed (shortness of breath).    Yes Historical Provider, MD  Oxycodone HCl 10 MG TABS Take 10 mg by mouth every 4 (four) hours as needed (pain).   Yes Historical Provider, MD  valACYclovir (VALTREX) 1000 MG tablet Take 1,000 mg by mouth 2 (two) times daily as needed (outbreak).   Yes Historical Provider, MD  cloNIDine (CATAPRES) 0.1 MG tablet Take 1 tablet (0.1 mg total) by mouth 3 (three) times daily. Patient not taking: Reported on 08/15/2016  03/24/16   Gena Frayyan G McQueen, MD  cyclobenzaprine (FLEXERIL) 10 MG tablet Take 1 tablet (10 mg total) by mouth 3 (three) times daily as needed for muscle spasms. Patient not taking: Reported on 08/15/2016 03/24/16   Gena Frayyan G McQueen, MD  gabapentin (NEURONTIN) 100 MG capsule Take 1 capsule (100 mg total) by mouth 2 (two) times daily. Patient not taking: Reported on 08/15/2016 03/24/16   Gena Frayyan G McQueen, MD  gabapentin (NEURONTIN) 300 MG capsule Take 2 capsules (600 mg total) by mouth at bedtime. Patient not taking: Reported on 08/15/2016 03/24/16   Gena Frayyan G McQueen, MD  QUEtiapine (SEROQUEL) 50 MG tablet Take 3 tablets (150 mg total) by mouth at bedtime. Patient not taking: Reported on 08/15/2016 03/24/16   Gena Frayyan G McQueen, MD  traZODone (DESYREL) 100 MG tablet Take 1 tablet (100 mg total) by mouth at bedtime. Patient not taking: Reported on 08/15/2016 03/24/16   Gena Frayyan G McQueen, MD    Family History No family history on file.  Social History Social History  Substance Use Topics  . Smoking status: Current Every Day Smoker    Packs/day: 0.50    Types: Cigarettes  . Smokeless tobacco: Never Used  . Alcohol use Yes     Comment: social     Allergies   Review of patient's allergies indicates no known allergies.   Review of Systems Review of Systems  Constitutional: Negative for chills, fatigue and fever.  HENT: Negative for congestion and sore throat.   Eyes: Negative for visual disturbance.  Respiratory: Negative for cough, chest tightness and shortness of breath.   Cardiovascular: Negative for chest pain and palpitations.  Gastrointestinal: Negative for abdominal pain, blood in stool, diarrhea, nausea and vomiting.  Genitourinary: Negative for decreased urine volume and difficulty urinating.  Musculoskeletal: Negative for back pain and neck stiffness.  Skin: Negative for rash.  Neurological: Negative for light-headedness and headaches.  Psychiatric/Behavioral: Negative for confusion, hallucinations  and suicidal ideas. The patient is not nervous/anxious.   All other systems reviewed and are negative.    Physical Exam Updated Vital Signs BP 119/64 (BP Location: Left Arm)   Pulse 86   Temp 97.9 F (36.6 C) (Oral)   Resp 20   SpO2 94%   Physical Exam  Constitutional: He is oriented to person, place, and time. He appears well-developed and well-nourished. No distress.  HENT:  Head: Normocephalic and atraumatic.  Nose: Nose normal.  Eyes: Conjunctivae and EOM are normal. Pupils are equal, round, and reactive to light. Right eye exhibits no discharge. Left eye exhibits no discharge. No scleral icterus.  Neck: Normal range of motion. Neck supple.  Cardiovascular: Normal rate and regular rhythm.  Exam reveals no gallop and no friction rub.   No murmur heard. Pulmonary/Chest: Effort normal and breath sounds normal. No stridor. No respiratory distress. He has no rales.  Abdominal: Soft. He exhibits no distension. There is no tenderness.  Musculoskeletal: He exhibits no edema or tenderness.  Neurological: He is alert and oriented to person, place, and time.  Skin: Skin is warm and dry. No rash noted. He is not diaphoretic. No erythema.  Psychiatric: He has a normal mood and affect.  Vitals reviewed.    ED Treatments / Results  Labs (all labs ordered are listed, but only abnormal results are displayed) Labs Reviewed  ACETAMINOPHEN LEVEL - Abnormal; Notable for the following:       Result Value   Acetaminophen (Tylenol), Serum <10 (*)    All other components within normal limits  CBC - Abnormal; Notable for the following:    WBC 12.8 (*)    All other components within normal limits  URINE RAPID DRUG SCREEN, HOSP PERFORMED - Abnormal; Notable for the following:    Opiates POSITIVE (*)    Benzodiazepines POSITIVE (*)    Amphetamines POSITIVE (*)    All other components within normal limits  COMPREHENSIVE METABOLIC PANEL  ETHANOL  SALICYLATE LEVEL    EKG  EKG  Interpretation None       Radiology No results found.  Procedures Procedures (including critical care time)  Medications Ordered in ED Medications  dicyclomine (BENTYL) tablet 20 mg (not administered)  hydrOXYzine (ATARAX/VISTARIL) tablet 25 mg (not administered)  loperamide (IMODIUM) capsule 2-4 mg (not administered)  methocarbamol (ROBAXIN) tablet 500 mg (not administered)  naproxen (NAPROSYN) tablet 500 mg (not administered)  ondansetron (ZOFRAN-ODT) disintegrating tablet 4 mg (not administered)  cloNIDine (CATAPRES) tablet 0.1 mg (0.1 mg Oral Given 08/15/16 2351)    Followed by  cloNIDine (CATAPRES) tablet 0.1 mg (not administered)    Followed by  cloNIDine (CATAPRES) tablet 0.1 mg (not administered)     Initial Impression / Assessment and Plan / ED Course  I have reviewed the triage vital signs and the nursing notes.  Pertinent labs & imaging results that were available during my care of the patient were reviewed by me and considered in my medical decision making (see chart for  details).  Clinical Course    On review of records and patient was seen at [facility] early last month for suicidal ideations and drug abuse. Patient at that time was accepted at [facility]; which patient says he never went to.  Psychiatric labs obtained. Revealed no medical instability. Psychiatry consulted for evaluation. Currently pending their assessment and recommendations  Final Clinical Impressions(s) / ED Diagnoses   Final diagnoses:  Substance abuse      Nira Conn, MD 08/16/16 [phone]

## 2016-08-15 NOTE — ED Notes (Signed)
Patient noted in room. No complaints, stable, in no acute distress. Q15 minute rounds and monitoring via Security Cameras to continue.  

## 2016-08-15 NOTE — ED Triage Notes (Addendum)
Pt Scott Brock after being IVC'd by his father for talking about suicide, taking heroin, threatening others, driving on drugs and being belligerent and argumentative. Alert and oriented at this time.   Upon speaking with the patient he adamantly denies suicidal thoughts or being dx with any kind of mental illness. He does admit to heroin use. States that his mother is bi-polar.

## 2016-08-15 NOTE — BH Assessment (Signed)
Spoke with Scott DistanceJames Brock, [phone]. States that the police stopped the patient for arguing with someone in the store, someone told the officer that it was like he was wondering he states that the patient was arrested for breaking and entering into his ex wife's home. He states that the patient has not told him that he was suicidal, he states that the patient was placed under IVC "a few months ago for telling my ex wife about suicide." He states that to his knowledge the patient has not made any threats to take his life or to kill anone else. The patients father states that the patient displays bizarre behavior and he is concerns that "acting like that out in public someone may hurt him." Patients father states that the patient is a heroin addict.    Davina PokeJoVea Cashmere Dingley, LCSW Therapeutic Triage Specialist [facility] 08/15/2016 8:55 PM

## 2016-08-15 NOTE — ED Notes (Signed)
Pt has been wanded by security. 

## 2016-08-15 NOTE — ED Notes (Signed)
Patient noted sleeping in room. No complaints, stable, in no acute distress. Q15 minute rounds and monitoring via Security Cameras to continue.  

## 2016-08-15 NOTE — ED Notes (Signed)
Pt. Transferred to [facility] from ED to room. Report to include Situation, Background, Assessment and Recommendations from [facility]. Pt. Oriented to unit including Q15 minute rounds as well as the security cameras for their protection. Patient is alert and oriented, warm and dry in no acute distress. Patient denies SI, HI, and AVH. Pt. Encouraged to let me know if needs arise.

## 2016-08-15 NOTE — BH Assessment (Addendum)
Assessment Note  Scott Brock is an 35 y.o. male presenting to [facility] under IVC by his father Scott Brock [phone].  IVC states:  Heroin addict Talks of suicide Walking in the street Belligerent and argumentative Threatening others Driving while using drugs  Patient denies being suicidal. Patient states that he got into an argument with his mother about using drugs and states that he "thinks my dad put that down so that I can get admitted or something, but I'm going to a nine month program." Patient states that he does use heroin. Patient states that he got into an argument. Patient denies threatening others. Patient denies driving while using drugs - patient states "I'm not driving while using drugs or anything." Patient denies the allegations of walking in the street and states that he is not sure what that is referring to.  Patient states that he is going to [employer] [facility] in [place], [place] for nine months and he is scheduled to go once he has been to traffic court on 09/03/2016. Patient contracts for safety. Patient states that he has a date later tonight. Patient denies SI and history of attempts. Patient denies self injurious behaviors. Patient denies HI and history of aggression. Patient states that he does have access to weapons in [place] in his Dad's house but denies access to weapons in [place]. Patient denies pending criminal charges but states that he has court on 09/03/2016 for a traffic ticket. Patient denies active probation. Patient denies AVH and does not appear to be responding to internal stimuli. Patient states that he uses an unknown amount of heroin and has used for the past two years. He denies use of other drugs and alcohol. Patient UDS +opiates, +benzodiazepines, + amphetamines. Patient denies being prescribed medications at this time. Patient denies using alcohol and BAL <5 at time of assessment.    Patient is alert and oriented and pleasant. Patient  denies history of treatment, however, patient was seen at [facility] on 07/23/2016 and transferred to [facility] for treatment per chart review. Patient denies outpatient medication management but states [facility] and "talked about my history with my parents and stuff." Patient denies history of trauma and abuse and states that his family is somewhat supportive. Patient states that his mother has been diagnosed with Bipolar Disorder.    Spoke with patients father to obtain collateral information. Patients father states that he received a call from the police after patient was found arguing at a store. He states that the police also told him that patient was "dancing or walking in the street." He states that the patient has a history of using heroin. Patients father states that he did not observe any of this but was told by law enforcement and he decided to place him under IVC. Patients father states that the patient has not spoke to him about suicide or made any explicit statements or actions referencing killing himself but states that the patient has been hospitalized "a couple months ago" for "talkinga bout suicide to my ex wife." Patients father states that he is not aware of the patient speaking of suicide since that time.  Patients father states that the patients behavior is bizarre and he is concerned that someone pay hurt the patient or the patient may hurt himself due to his bizarre behavior.   Consulted with Alberteen Sam, NP who recommends overnight observation and evaluation by psychiatry in  the morning.    Diagnosis:  Opioid use disorder, Moderate  Past Medical History:  Past Medical History:  Diagnosis Date  . Acid reflux   . Asthma   . Chronic back pain   . Renal disorder   . Status post dilation of esophageal narrowing     Past Surgical History:  Procedure Laterality Date  . APPENDECTOMY    . RHINOPLASTY    . UPPER ENDOSCOPY W/ ESOPHAGEAL  MANOMETRY      Family History: No family history on file.  Social History:  reports that he has been smoking Cigarettes.  He has been smoking about 0.50 packs per day. He has never used smokeless tobacco. He reports that he drinks alcohol. He reports that he uses drugs, including Marijuana and IV.  Additional Social History:  Alcohol / Drug Use Pain Medications: Denies Prescriptions: Denies Over the Counter: Denies History of alcohol / drug use?: Yes Longest period of sobriety (when/how long): "weeks" Substance #1 Name of Substance 1: Heroin 1 - Age of First Use: 33 1 - Amount (size/oz): "not a lot" 1 - Frequency: "I don't know" 1 - Duration: ongoing 1 - Last Use / Amount: This morning  CIWA: CIWA-Ar BP: 136/69 Pulse Rate: 75 COWS:    Allergies: No Known Allergies  Home Medications:  (Not in a hospital admission)  OB/GYN Status:  No LMP for male patient.  General Assessment Data [facility] of [facility]: [facility] TTS Assessment: In system Is this a Tele or Face-to-Face Assessment?: Face-to-Face Is this an Initial Assessment or a Re-assessment for this encounter?: Initial Assessment Marital status: Divorced Is patient pregnant?: No Pregnancy Status: No Living Arrangements: Parent (Mother) Can pt return to current living arrangement?: Yes Admission Status: Involuntary Is patient capable of signing voluntary admission?: No (IVC) Referral Source: Other (IVC)     Crisis Care Plan Living Arrangements: Parent (Mother) Name of Psychiatrist: None Name of Therapist: None  Education Status Is patient currently in school?: No Highest grade of school patient has completed: some college  Risk to self with the past 6 months Suicidal Ideation: No Has patient been a risk to self within the past 6 months prior to admission? : No Suicidal Intent: No Has patient had any suicidal intent within the past 6 months prior to admission? : No Is patient at risk for suicide?:  No Suicidal Plan?: No Has patient had any suicidal plan within the past 6 months prior to admission? : No Access to Means: No Specify Access to Suicidal Means: Denies What has been your use of drugs/alcohol within the last 12 months?: Heroin Previous Attempts/Gestures: No How many times?: 0 Other Self Harm Risks: Denies Triggers for Past Attempts: None known Intentional Self Injurious Behavior: None Family Suicide History: No Recent stressful life event(s): Conflict (Comment) (with parents) Persecutory voices/beliefs?: No Depression: No Depression Symptoms:  (denies symptoms) Substance abuse history and/or treatment for substance abuse?: Yes Suicide prevention information given to non-admitted patients: Not applicable  Risk to Others within the past 6 months Homicidal Ideation: No Does patient have any lifetime risk of violence toward others beyond the six months prior to admission? : No Thoughts of Harm to Others: No Current Homicidal Intent: No Current Homicidal Plan: No Access to Homicidal Means: No Identified Victim: Denies History of harm to others?: No Assessment of Violence: None Noted Violent Behavior Description: Denies Does patient have access to weapons?: Yes (Comment) (in [place] at his fathers house) Criminal Charges Pending?: No Does patient have a  court date: No Is patient on probation?: No  Psychosis Hallucinations: None noted Delusions: None noted  Mental Status Report Appearance/Hygiene: Body odor, In scrubs Eye Contact: Fair Motor Activity: Unremarkable Speech: Logical/coherent Level of Consciousness: Alert Mood: Pleasant Affect: Appropriate to circumstance Anxiety Level: Minimal Thought Processes: Coherent, Relevant Judgement: Partial Orientation: Person, Place, Time, Situation, Appropriate for developmental age Obsessive Compulsive Thoughts/Behaviors: None  Cognitive Functioning Concentration: Normal Memory: Recent Intact, Remote  Intact IQ: Average Insight: Fair Impulse Control: Fair Appetite: Good Weight Loss: 0 Weight Gain: 0 Sleep: No Change Total Hours of Sleep:  (8-9) Vegetative Symptoms: None  ADLScreening [facility] Assessment Services) Patient's cognitive ability adequate to safely complete daily activities?: Yes Patient able to express need for assistance with ADLs?: Yes Independently performs ADLs?: Yes (appropriate for developmental age)  Prior Inpatient Therapy Prior Inpatient Therapy: No Prior Therapy Dates: N/A Prior Therapy Facilty/Provider(s): N/A Reason for Treatment: N/A  Prior Outpatient Therapy Prior Outpatient Therapy: Yes Prior Therapy Dates: 2017 [facility] Reason for Treatment: "just talk about my past" Does patient have an ACCT team?: No Does patient have Intensive In-House Services?  : No Does patient have [facility] services? : No Does patient have P4CC services?: No  ADL Screening (condition at time of admission) Patient's cognitive ability adequate to safely complete daily activities?: Yes Is the patient deaf or have difficulty hearing?: No Does the patient have difficulty seeing, even when wearing glasses/contacts?: No Does the patient have difficulty concentrating, remembering, or making decisions?: No Patient able to express need for assistance with ADLs?: Yes Does the patient have difficulty dressing or bathing?: No Independently performs ADLs?: Yes (appropriate for developmental age) Does the patient have difficulty walking or climbing stairs?: No Weakness of Legs: None Weakness of Arms/Hands: None  Home Assistive Devices/Equipment Home Assistive Devices/Equipment: None  Therapy Consults (therapy consults require a physician order) PT Evaluation Needed: No OT Evalulation Needed: No SLP Evaluation Needed: No Abuse/Neglect Assessment (Assessment to be complete while patient is alone) Physical Abuse: Denies Verbal Abuse: Denies Sexual  Abuse: Denies Exploitation of patient/patient's resources: Denies Self-Neglect: Denies Values / Beliefs Cultural Requests During Hospitalization: None Spiritual Requests During Hospitalization: None Consults Spiritual Care Consult Needed: No Social Work Consult Needed: No [employer] (For Healthcare) Does patient have an advance directive?: Yes Type of Advance Directive: Midwife (Mother) Copy of advanced directive(s) in chart?: No - copy requested    Additional Information 1:1 In Past 12 Months?: No CIRT Risk: No Elopement Risk: No Does patient have medical clearance?: Yes     Disposition:  Disposition Initial Assessment Completed for this Encounter: Yes Disposition of Patient: Other dispositions (observe overnight and AM psych evaluation. ) Other disposition(s): Other (Comment) (per Evonnie Dawes, FNP)  On Site Evaluation by:   Reviewed with Physician:    Britta Louth 08/15/2016 9:11 PM 3

## 2016-08-16 ENCOUNTER — Inpatient Hospital Stay (HOSPITAL_COMMUNITY)
Admission: AD | Admit: 2016-08-16 | Discharge: 2016-08-19 | DRG: 885 | Disposition: A | Discharge status: Home / Self Care | Attending: Psychiatry | Admitting: Psychiatry

## 2016-08-16 ENCOUNTER — Encounter (HOSPITAL_COMMUNITY): Payer: Self-pay

## 2016-08-16 DIAGNOSIS — F332 Major depressive disorder, recurrent severe without psychotic features: Secondary | ICD-10-CM | POA: Diagnosis present

## 2016-08-16 DIAGNOSIS — Z818 Family history of other mental and behavioral disorders: Secondary | ICD-10-CM | POA: Diagnosis not present

## 2016-08-16 DIAGNOSIS — G4700 Insomnia, unspecified: Secondary | ICD-10-CM | POA: Diagnosis present

## 2016-08-16 DIAGNOSIS — R45851 Suicidal ideations: Secondary | ICD-10-CM | POA: Diagnosis present

## 2016-08-16 DIAGNOSIS — F17210 Nicotine dependence, cigarettes, uncomplicated: Secondary | ICD-10-CM | POA: Diagnosis present

## 2016-08-16 DIAGNOSIS — F419 Anxiety disorder, unspecified: Secondary | ICD-10-CM | POA: Diagnosis present

## 2016-08-16 DIAGNOSIS — F1124 Opioid dependence with opioid-induced mood disorder: Secondary | ICD-10-CM | POA: Diagnosis not present

## 2016-08-16 DIAGNOSIS — F322 Major depressive disorder, single episode, severe without psychotic features: Secondary | ICD-10-CM

## 2016-08-16 DIAGNOSIS — F1120 Opioid dependence, uncomplicated: Secondary | ICD-10-CM | POA: Diagnosis present

## 2016-08-16 DIAGNOSIS — F333 Major depressive disorder, recurrent, severe with psychotic symptoms: Secondary | ICD-10-CM | POA: Diagnosis present

## 2016-08-16 DIAGNOSIS — F1123 Opioid dependence with withdrawal: Secondary | ICD-10-CM | POA: Diagnosis present

## 2016-08-16 MED ORDER — DICYCLOMINE HCL 20 MG PO TABS: 20.0000 mg | ORAL_TABLET | Freq: Four times a day (QID) | ORAL | Status: DC | PRN

## 2016-08-16 MED ORDER — TRAZODONE HCL 100 MG PO TABS
100.0000 mg | ORAL_TABLET | Freq: Every evening | ORAL | Status: DC | PRN
  Administered 2016-08-16: 100 mg via ORAL
  Filled 2016-08-16: qty 1

## 2016-08-16 MED ORDER — ALUM & MAG HYDROXIDE-SIMETH 200-200-20 MG/5ML PO SUSP: 30.0000 mL | ORAL | Status: DC | PRN

## 2016-08-16 MED ORDER — CLONIDINE HCL 0.1 MG PO TABS
0.1000 mg | ORAL_TABLET | ORAL | Status: DC
  Filled 2016-08-16 (×4): qty 1

## 2016-08-16 MED ORDER — MAGNESIUM HYDROXIDE 400 MG/5ML PO SUSP: 30.0000 mL | Freq: Every day | ORAL | Status: DC | PRN

## 2016-08-16 MED ORDER — LOPERAMIDE HCL 2 MG PO CAPS: 2.0000 mg | ORAL_CAPSULE | ORAL | Status: DC | PRN

## 2016-08-16 MED ORDER — METHOCARBAMOL 500 MG PO TABS
500.0000 mg | ORAL_TABLET | Freq: Three times a day (TID) | ORAL | Status: DC | PRN
  Administered 2016-08-16 – 2016-08-17 (×2): 500 mg via ORAL
  Filled 2016-08-16 (×2): qty 1

## 2016-08-16 MED ORDER — ACETAMINOPHEN 325 MG PO TABS: 650.0000 mg | ORAL_TABLET | Freq: Four times a day (QID) | ORAL | Status: DC | PRN

## 2016-08-16 MED ORDER — CLONIDINE HCL 0.1 MG PO TABS
0.1000 mg | ORAL_TABLET | Freq: Four times a day (QID) | ORAL | Status: AC
  Administered 2016-08-16 – 2016-08-18 (×3): 0.1 mg via ORAL
  Filled 2016-08-16 (×11): qty 1

## 2016-08-16 MED ORDER — GABAPENTIN 100 MG PO CAPS
200.0000 mg | ORAL_CAPSULE | Freq: Three times a day (TID) | ORAL | Status: DC
  Administered 2016-08-16: 200 mg via ORAL
  Filled 2016-08-16: qty 2

## 2016-08-16 MED ORDER — GABAPENTIN 100 MG PO CAPS
200.0000 mg | ORAL_CAPSULE | Freq: Three times a day (TID) | ORAL | Status: DC
  Administered 2016-08-18: 200 mg via ORAL
  Filled 2016-08-16 (×16): qty 2

## 2016-08-16 MED ORDER — HYDROXYZINE HCL 25 MG PO TABS
25.0000 mg | ORAL_TABLET | Freq: Four times a day (QID) | ORAL | Status: DC | PRN
  Administered 2016-08-16 – 2016-08-18 (×3): 25 mg via ORAL
  Filled 2016-08-16 (×3): qty 1

## 2016-08-16 MED ORDER — TRAZODONE HCL 100 MG PO TABS: 100.0000 mg | ORAL_TABLET | Freq: Every evening | ORAL | Status: DC | PRN

## 2016-08-16 MED ORDER — ONDANSETRON 4 MG PO TBDP: 4.0000 mg | ORAL_TABLET | Freq: Four times a day (QID) | ORAL | Status: DC | PRN

## 2016-08-16 MED ORDER — CLONIDINE HCL 0.1 MG PO TABS
0.1000 mg | ORAL_TABLET | Freq: Every day | ORAL | Status: DC
  Filled 2016-08-16: qty 1

## 2016-08-16 MED ORDER — NICOTINE POLACRILEX 2 MG MT GUM
2.0000 mg | CHEWING_GUM | OROMUCOSAL | Status: DC | PRN
  Administered 2016-08-18 – 2016-08-19 (×3): 2 mg via ORAL
  Filled 2016-08-16 (×3): qty 1

## 2016-08-16 MED ORDER — NAPROXEN 500 MG PO TABS
500.0000 mg | ORAL_TABLET | Freq: Two times a day (BID) | ORAL | Status: DC | PRN
  Administered 2016-08-18: 500 mg via ORAL
  Filled 2016-08-16: qty 1

## 2016-08-16 NOTE — Progress Notes (Signed)
Pt sleeping when ED CM went to see him to inquire about community providers for care like pcp  Pt NOT awaken

## 2016-08-16 NOTE — ED Notes (Signed)
Patient noted sleeping in room. No complaints, stable, in no acute distress. Q15 minute rounds and monitoring via Security Cameras to continue.  

## 2016-08-16 NOTE — ED Notes (Signed)
Pt is staying in bed with his head under the covers. He seems to avoid making eye contact but when asked about it he said, "I am just lying on my side, not avoiding eye contact". There is a wound on his lower right lip, and said that it is a cold sore. There is a wound between his eyes and over his left eyebrow and he said that he has been picking at these areas. Pt denies SI/HI, AVH, and does not appear to be responding to internal stimuli. States that he is retired from [employer]. Admits to heroin addiction, but does not appear to be very vested in getting off heroin. He said that his father initiated his IVC, and said that everything his father said is a lie. According to pt, his father just wanted him to be locked up. He denies any withdrawal symptoms at this time. He is taking medications offered to him.

## 2016-08-16 NOTE — Tx Team (Signed)
Initial Treatment Plan 08/16/2016 4:35 PM Scott RainbowMark J Lefferts [id number]    PATIENT STRESSORS: Legal issue Marital or family conflict Substance abuse   PATIENT STRENGTHS: Capable of independent living General fund of knowledge   PATIENT IDENTIFIED PROBLEMS: Substance abuse  Anxiety  "Just to get better"  "Rest"               DISCHARGE CRITERIA:  Improved stabilization in mood, thinking, and/or behavior Withdrawal symptoms are absent or subacute and managed without 24-hour nursing intervention  PRELIMINARY DISCHARGE PLAN: Outpatient therapy medication management  PATIENT/FAMILY INVOLVEMENT: This treatment plan has been presented to and reviewed with the patient, Scott Brock.  The patient and family have been given the opportunity to ask questions and make suggestions.  Levin BaconHeather V Theressa Piedra, RN 08/16/2016, 4:35 PM

## 2016-08-16 NOTE — Progress Notes (Signed)
Scott Brock is a 35 year old male being admitted involuntarily to [facility] from [facility].  He was brought to the ED under IVC taken out by his father.  IVC stated:  heroin addict, talks of suicide, walking in the street, belligerent and argumentative, threatening others and driving while using drugs.  Father reported at Scott Brock assessment that he received a call from the police because pt was arguing at a store.  Pt. denies above and stated that he got into an argument with his mother about using drugs and his father put that down so he will get treatment.  He stated that he was planning on going to 9 month program [place]  He denies SI/HI or A/V hallucinations.  He reported having a court date 09/03/16 in traffic court.  He has history of treatment at [facility] 07/23/16 and saw a therapist at the [facility] 2 weeks ago.  He is diagnosed with opiate use disorder.  Oriented him to the unit.  He denies SI/HI or A/V hallucinations.  He stated that he has problems with his back after ruptured disc in lower back as was discharged from the [facility].  Admission paperwork completed and signed.  Belongings searched and secured in locker # 44.  Skin assessment completed and noted sores on face and abrasion on both arms.  Q 15 minute checks initiated for safety.  We will monitor the progress towards his goals.

## 2016-08-16 NOTE — Consult Note (Signed)
[facility] Face-to-Face Psychiatry Consult   Reason for Consult:  Suicide threat Referring Physician:  EDP Patient Identification: Scott Brock MRN:  [id number] Principal Diagnosis: Major depressive disorder, single episode, severe without psychotic features [facility]) Diagnosis:   Patient Active Problem List   Diagnosis Date Noted  . Major depressive disorder, single episode, severe without psychotic features ([place]) [F32.2] 03/20/2016    Priority: High  . Opioid use disorder, severe, dependence ([place]) [F11.20] 03/20/2016    Priority: High  . Substance induced mood disorder ([place]) [F19.94] 07/22/2016  . Alcohol abuse [F10.10] 07/22/2016  . Involuntary commitment [Z04.6] 07/22/2016  . Tobacco use disorder [F17.200] 03/20/2016    Total Time spent with patient: 45 minutes  Subjective:   Scott Brock is a 35 y.o. male patient admitted with depression and suicide threat.  HPI:  35 yo male who presented to the ED after being IVC'd by his father for threatening to hurt himself and driving under the influence.  He uses about 2 grams of heroin daily but is not interested in rehab or assistance.  Denies hallucinations and withdrawal symptoms.  Minimizes his actions and issues, not stable for discharge.  Past Psychiatric History: substance abuse  Risk to Self: Suicidal Ideation: No Suicidal Intent: No Is patient at risk for suicide?: No Suicidal Plan?: No Access to Means: No Specify Access to Suicidal Means: Denies What has been your use of drugs/alcohol within the last 12 months?: Heroin How many times?: 0 Other Self Harm Risks: Denies Triggers for Past Attempts: None known Intentional Self Injurious Behavior: None Risk to Others: Homicidal Ideation: No Thoughts of Harm to Others: No Current Homicidal Intent: No Current Homicidal Plan: No Access to Homicidal Means: No Identified Victim: Denies History of harm to others?: No Assessment of Violence: None Noted Violent Behavior Description:  Denies Does patient have access to weapons?: Yes (Comment) (in [place] at his fathers house) Criminal Charges Pending?: No Does patient have a court date: No Prior Inpatient Therapy: Prior Inpatient Therapy: No Prior Therapy Dates: N/A Prior Therapy Facilty/Provider(s): N/A Reason for Treatment: N/A Prior Outpatient Therapy: Prior Outpatient Therapy: Yes Prior Therapy Dates: 2017 [facility] Reason for Treatment: "just talk about my past" Does patient have an ACCT team?: No Does patient have Intensive In-House Services?  : No Does patient have [facility] services? : No Does patient have P4CC services?: No  Past Medical History:  Past Medical History:  Diagnosis Date  . Acid reflux   . Asthma   . Chronic back pain   . Renal disorder   . Status post dilation of esophageal narrowing     Past Surgical History:  Procedure Laterality Date  . APPENDECTOMY    . RHINOPLASTY    . UPPER ENDOSCOPY W/ ESOPHAGEAL MANOMETRY     Family History: No family history on file. Family Psychiatric  History: none Social History:  History  Alcohol Use  . Yes    Comment: social     History  Drug Use  . Types: Marijuana, IV    Comment: heroin    Social History   Social History  . Marital status: Single    Spouse name: N/A  . Number of children: N/A  . Years of education: N/A   Social History Main Topics  . Smoking status: Current Every Day Smoker    Packs/day: 0.50    Types: Cigarettes  . Smokeless tobacco: Never Used  . Alcohol use Yes     Comment: social  .  Drug use:     Types: Marijuana, IV     Comment: heroin  . Sexual activity: Yes    Birth control/ protection: None   Other Topics Concern  . Not on file   Social History Narrative  . No narrative on file   Additional Social History:    Allergies:  No Known Allergies  Labs:  Results for orders placed or performed during the hospital encounter of 08/15/16 (from the past 48 hour(s))   Rapid urine drug screen (hospital performed)     Status: Abnormal   Collection Time: 08/15/16  5:17 PM  Result Value Ref Range   Opiates POSITIVE (A) NONE DETECTED   Cocaine NONE DETECTED NONE DETECTED   Benzodiazepines POSITIVE (A) NONE DETECTED   Amphetamines POSITIVE (A) NONE DETECTED   Tetrahydrocannabinol NONE DETECTED NONE DETECTED   Barbiturates NONE DETECTED NONE DETECTED    Comment:        DRUG SCREEN FOR MEDICAL PURPOSES ONLY.  IF CONFIRMATION IS NEEDED FOR ANY PURPOSE, NOTIFY LAB WITHIN 5 DAYS.        LOWEST DETECTABLE LIMITS FOR URINE DRUG SCREEN Drug Class       Cutoff (ng/mL) Amphetamine      1000 Barbiturate      200 Benzodiazepine   858 Tricyclics       850 Opiates          300 Cocaine          300 THC              50   Comprehensive metabolic panel     Status: None   Collection Time: 08/15/16  5:45 PM  Result Value Ref Range   Sodium 139 135 - 145 mmol/L   Potassium 4.2 3.5 - 5.1 mmol/L   Chloride 108 101 - 111 mmol/L   CO2 24 22 - 32 mmol/L   Glucose, Bld 90 65 - 99 mg/dL   BUN 11 6 - 20 mg/dL   Creatinine, Ser 1.00 0.61 - 1.24 mg/dL   Calcium 8.9 8.9 - 10.3 mg/dL   Total Protein 6.9 6.5 - 8.1 g/dL   Albumin 4.6 3.5 - 5.0 g/dL   AST 23 15 - 41 U/L   ALT 29 17 - 63 U/L   Alkaline Phosphatase 91 38 - 126 U/L   Total Bilirubin 0.5 0.3 - 1.2 mg/dL   GFR calc non Af Amer >60 >60 mL/min   GFR calc Af Amer >60 >60 mL/min    Comment: (NOTE) The eGFR has been calculated using the CKD EPI equation. This calculation has not been validated in all clinical situations. eGFR's persistently <60 mL/min signify possible Chronic Kidney Disease.    Anion gap 7 5 - 15  cbc     Status: Abnormal   Collection Time: 08/15/16  5:45 PM  Result Value Ref Range   WBC 12.8 (H) 4.0 - 10.5 K/uL   RBC 4.85 4.22 - 5.81 MIL/uL   Hemoglobin 15.4 13.0 - 17.0 g/dL   HCT 44.0 39.0 - 52.0 %   MCV 90.7 78.0 - 100.0 fL   MCH 31.8 26.0 - 34.0 pg   MCHC 35.0 30.0 - 36.0 g/dL    RDW 12.6 11.5 - 15.5 %   Platelets 237 150 - 400 K/uL  Ethanol     Status: None   Collection Time: 08/15/16  5:47 PM  Result Value Ref Range   Alcohol, Ethyl (B) <5 <5 mg/dL    Comment:  LOWEST DETECTABLE LIMIT FOR SERUM ALCOHOL IS 5 mg/dL FOR MEDICAL PURPOSES ONLY   Salicylate level     Status: None   Collection Time: 08/15/16  5:47 PM  Result Value Ref Range   Salicylate Lvl <7.1 2.8 - 30.0 mg/dL  Acetaminophen level     Status: Abnormal   Collection Time: 08/15/16  5:47 PM  Result Value Ref Range   Acetaminophen (Tylenol), Serum <10 (L) 10 - 30 ug/mL    Comment:        THERAPEUTIC CONCENTRATIONS VARY SIGNIFICANTLY. A RANGE OF 10-30 ug/mL MAY BE AN EFFECTIVE CONCENTRATION FOR MANY PATIENTS. HOWEVER, SOME ARE BEST TREATED AT CONCENTRATIONS OUTSIDE THIS RANGE. ACETAMINOPHEN CONCENTRATIONS >150 ug/mL AT 4 HOURS AFTER INGESTION AND >50 ug/mL AT 12 HOURS AFTER INGESTION ARE OFTEN ASSOCIATED WITH TOXIC REACTIONS.     Current Facility-Administered Medications  Medication Dose Route Frequency Provider Last Rate Last Dose  . cloNIDine (CATAPRES) tablet 0.1 mg  0.1 mg Oral QID Lurena Nida, NP   0.1 mg at 08/16/16 1021   Followed by  . [START ON 08/18/2016] cloNIDine (CATAPRES) tablet 0.1 mg  0.1 mg Oral BH-qamhs Lurena Nida, NP       Followed by  . [START ON 08/21/2016] cloNIDine (CATAPRES) tablet 0.1 mg  0.1 mg Oral QAC breakfast Lurena Nida, NP      . dicyclomine (BENTYL) tablet 20 mg  20 mg Oral Q6H PRN Lurena Nida, NP      . gabapentin (NEURONTIN) capsule 200 mg  200 mg Oral TID Corena Pilgrim, MD      . hydrOXYzine (ATARAX/VISTARIL) tablet 25 mg  25 mg Oral Q6H PRN Lurena Nida, NP      . loperamide (IMODIUM) capsule 2-4 mg  2-4 mg Oral PRN Lurena Nida, NP      . methocarbamol (ROBAXIN) tablet 500 mg  500 mg Oral Q8H PRN Lurena Nida, NP      . naproxen (NAPROSYN) tablet 500 mg  500 mg Oral BID PRN Lurena Nida, NP      . ondansetron (ZOFRAN-ODT)  disintegrating tablet 4 mg  4 mg Oral Q6H PRN Lurena Nida, NP      . traZODone (DESYREL) tablet 100 mg  100 mg Oral QHS PRN Corena Pilgrim, MD       Current Outpatient Prescriptions  Medication Sig Dispense Refill  . Fluticasone-Salmeterol (ADVAIR) 250-50 MCG/DOSE AEPB Inhale 1 puff into the lungs every 12 (twelve) hours as needed (shortness of breath).     . Oxycodone HCl 10 MG TABS Take 10 mg by mouth every 4 (four) hours as needed (pain).    . valACYclovir (VALTREX) 1000 MG tablet Take 1,000 mg by mouth 2 (two) times daily as needed (outbreak).    . cloNIDine (CATAPRES) 0.1 MG tablet Take 1 tablet (0.1 mg total) by mouth 3 (three) times daily. (Patient not taking: Reported on 08/15/2016) 15 tablet 0  . cyclobenzaprine (FLEXERIL) 10 MG tablet Take 1 tablet (10 mg total) by mouth 3 (three) times daily as needed for muscle spasms. (Patient not taking: Reported on 08/15/2016) 15 tablet 0  . gabapentin (NEURONTIN) 100 MG capsule Take 1 capsule (100 mg total) by mouth 2 (two) times daily. (Patient not taking: Reported on 08/15/2016) 60 capsule 0  . gabapentin (NEURONTIN) 300 MG capsule Take 2 capsules (600 mg total) by mouth at bedtime. (Patient not taking: Reported on 08/15/2016) 60 capsule 0  . QUEtiapine (SEROQUEL) 50 MG tablet Take  3 tablets (150 mg total) by mouth at bedtime. (Patient not taking: Reported on 08/15/2016) 90 tablet 0  . traZODone (DESYREL) 100 MG tablet Take 1 tablet (100 mg total) by mouth at bedtime. (Patient not taking: Reported on 08/15/2016) 30 tablet 0    Musculoskeletal: Strength & Muscle Tone: within normal limits Gait & Station: normal Patient leans: N/A  Psychiatric Specialty Exam: Physical Exam  Constitutional: He appears well-developed and well-nourished.  HENT:  Head: Normocephalic.  Neck: Normal range of motion.  Respiratory: Effort normal.  Musculoskeletal: Normal range of motion.  Neurological: He is alert.  Skin: Skin is warm and dry.  Psychiatric: His speech  is normal and behavior is normal. His mood appears anxious. Cognition and memory are normal. He expresses impulsivity. He exhibits a depressed mood. He expresses suicidal ideation. He expresses suicidal plans.    Review of Systems  Constitutional: Negative.   HENT: Negative.   Eyes: Negative.   Respiratory: Negative.   Cardiovascular: Negative.   Gastrointestinal: Negative.   Genitourinary: Negative.   Musculoskeletal: Negative.   Skin: Negative.   Neurological: Negative.   Endo/Heme/Allergies: Negative.   Psychiatric/Behavioral: Positive for depression, substance abuse and suicidal ideas. The patient is nervous/anxious.     Blood pressure 120/70, pulse 75, temperature 97.5 F (36.4 C), temperature source Oral, resp. rate 18, SpO2 100 %.There is no height or weight on file to calculate BMI.  General Appearance: Guarded  Eye Contact:  Fair  Speech:  Normal Rate  Volume:  Normal  Mood:  Anxious and Depressed  Affect:  Congruent  Thought Process:  Coherent and Descriptions of Associations: Intact  Orientation:  Full (Time, Place, and Person)  Thought Content:  WDL  Suicidal Thoughts:  Yes.  with intent/plan  Homicidal Thoughts:  No  Memory:  Immediate;   Fair Recent;   Fair Remote;   Fair  Judgement:  Impaired  Insight:  Lacking  Psychomotor Activity:  Normal  Concentration:  Concentration: Fair and Attention Span: Fair  Recall:  [employer] of Knowledge:  Fair  Language:  Good  Akathisia:  No  Handed:  Right  AIMS (if indicated):     Assets:  Leisure Time Physical Health Resilience Social Support  ADL's:  Intact  Cognition:  WNL  Sleep:        Treatment Plan Summary: Daily contact with patient to assess and evaluate symptoms and progress in treatment, Medication management and Plan major depressive disorder, single episode, severe without psychosis:  -Crisis stabilization -Medication management:  Start Clonidine Opiate Withdrawal Protocol and gabapentin 200 mg TID  for withdrawal symptoms and Trazodone 100 mg at bedtime for sleep -Individual and substance abuse counseling  Disposition: Recommend psychiatric Inpatient admission when medically cleared.  Waylan Boga, NP 08/16/2016 10:38 AM  Patient seen face-to-face for psychiatric evaluation, chart reviewed and case discussed with the physician extender and developed treatment plan. Reviewed the information documented and agree with the treatment plan. Corena Pilgrim, MD

## 2016-08-16 NOTE — ED Notes (Signed)
[facility] All belongings returned to pt who signed for same.

## 2016-08-16 NOTE — BH Assessment (Signed)
[facility] Assessment Progress Note  Per Thedore MinsMojeed Akintayo, MD, this pt requires psychiatric hospitalization at this time.  Berneice Heinrichina Tate, RN, [id number]; they will be ready to receive pt at 14:00.  Pt presents under IVC which Dr Jannifer FranklinAkintayo has upheld, and [facility].  Pt's nurse, Diane, has been notified, and agrees to call report to [phone].  Pt is to be transported via [employer] examinerlaw enforcement.  Doylene Canninghomas Wendall Isabell, MA Triage Specialist [phone]

## 2016-08-17 DIAGNOSIS — F1124 Opioid dependence with opioid-induced mood disorder: Secondary | ICD-10-CM

## 2016-08-17 MED ORDER — TRAZODONE HCL 150 MG PO TABS
150.0000 mg | ORAL_TABLET | Freq: Every evening | ORAL | Status: DC | PRN
  Administered 2016-08-17: 150 mg via ORAL
  Filled 2016-08-17: qty 1

## 2016-08-17 NOTE — [facility] Group Notes (Signed)
[facility] LCSW Group Therapy 08/17/2016  1:15 PM   Type of Therapy: Group Therapy  Participation Level: Did Not Attend. Patient invited to participate but declined.   Lissete Maestas, MSW, LCSW Clinical Social Worker [facility] [phone]    
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
## 2016-08-17 NOTE — Plan of Care (Signed)
Problem: Activity: Goal: Interest or engagement in activities will improve Outcome: Not Progressing Patient is not participating in any unit activities.  Has stayed in bed since admission.

## 2016-08-17 NOTE — [facility] Counselor (Signed)
CSW attempted to complete assessment with patient. He was in bed asleep and declined to meet with CSW. PSA to be attempted at a later time.  Samuella BruinKristin Dorcus Riga, LCSW Clinical Social Worker [facility] [phone]

## 2016-08-17 NOTE — Progress Notes (Signed)
Patient ID: Scott Brock, male   DOB: 03-20-1981, 35 y.o.   MRN: [id number] D: Patient has been in bed; refused to come get his medications.  He reports withdrawal symptoms as tremors, cravings, agitation, runny nose, chilling and irritability.  He denies any thoughts of self harm.  He is on the clonidine protocol, however, has not requested any medications for withdrawal.  He rates his depression and hopelessness as a 5; anxiety as a 6.  Patient's goal today is to "socialize." A: Continue to monitor medication management and MD orders.  Safety checks completed every 15 minutes per protocol.  Offer support and encouragement as needed. R: Patient has minimal interaction with staff and peers.  He does not seem vested in his treatment.

## 2016-08-17 NOTE — [facility] Group Notes (Signed)
[facility] Group Notes:  (Nursing/MHT/Case Management/Adjunct)  Date:  08/17/2016  Time:  9:24 AM  Type of Therapy:  Psychoeducational Skills  Participation Level:  Did Not Attend  Patient invited; declined to attend.  Cranford MonBeaudry, Sibley Rolison Evans 08/17/2016, 9:24 AM

## 2016-08-17 NOTE — H&P (Signed)
Psychiatric Admission Assessment Adult  Patient Identification: Scott Brock MRN:  [id number] Date of Evaluation:  08/17/2016 Chief Complaint:   " I have been using heroin " Principal Diagnosis: Opiate Dependence, Opiate Induced Mood Disorder  Diagnosis:   Patient Active Problem List   Diagnosis Date Noted  . Major depressive disorder, recurrent episode, severe, with psychosis (HCC) [F33.3] 08/16/2016  . Substance induced mood disorder (HCC) [F19.94] 07/22/2016  . Alcohol abuse [F10.10] 07/22/2016  . Involuntary commitment [Z04.6] 07/22/2016  . 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 Opioid use disorder, severe, dependence (HCC) [F11.20] 03/20/2016  . Tobacco use disorder [F17.200] 03/20/2016   History of Present Illness: 35 year old male, history of opiate dependence, has been abusing heroin as substance of choice, using daily. Denies IVDA ( insufflates ) . Patient was admitted under commitment generated by father, reporting that patient was making suicidal statements and threatening others , being belligerent . Patient minimizes the above , states " he just had to say that stuff to make sure I would get admitted ", but does endorse he has been depressed, sad, which he attributes to his substance abuse . At this time reports symptoms of opiate WDL , mainly diffuse aches, a sense of malaise, rhinorrhea, some nausea. Denies vomiting . States " I think I am over the hump of the withdrawal ".   Associated Signs/Symptoms: Depression Symptoms:  depressed mood, anhedonia, loss of energy/fatigue,  At this time denies any recent suicidal ideations , states appetite has been " normal", denies weight loss  (Hypo) Manic Symptoms:  denies  Anxiety Symptoms:  denies  Psychotic Symptoms:  denies  PTSD Symptoms: denies  Total Time spent with patient: 45 minutes  Past Psychiatric History: he reports several prior psychiatric admissions  , last time several weeks to months ago at [facility].  States he has never attempted suicide , denies history of self cutting . States reasons for psychiatric admissions always revolve around substance abuse . Endorses substance induced mood disorder, but at this time does not endorse or report underlying depression other than substance induced Denies history of mania, denies history of psychosis, denies history of panic or agoraphobia, at this time not endorsing history of PTSD   Is the patient at risk to self? Yes.    Has the patient been a risk to self in the past 6 months? No.  Has the patient been a risk to self within the distant past? No.  Is the patient a risk to others? No.  Has the patient been a risk to others in the past 6 months? No.  Has the patient been a risk to others within the distant past? No.   Prior Inpatient Therapy:  as above  Prior Outpatient Therapy:  not at present   Alcohol Screening: 1. How often do you have a drink containing alcohol?: 2 to 3 times a week 2. How many drinks containing alcohol do you have on a typical day when you are drinking?: 3 or 4 3. How often do you have six or more drinks on one occasion?: Never Preliminary Score: 1 9. Have you or someone else been injured as a result of your drinking?: No 10. Has a relative or friend or a doctor or another health worker been concerned about your drinking or suggested you cut down?: Yes, but not in the last year Alcohol Use Disorder Identification Test Final Score (AUDIT): 6 Brief Intervention: AUDIT score less  than 7 or less-screening does not suggest unhealthy drinking-brief intervention not indicated Substance Abuse History in the last 12 months:  At this time endorses history of Opiate dependence ( Heroin ) as above . History of opiate analgesic abuse, but states " since I started on heroin it is the only thing I have been using". Describes episodic drinking but at this time denies alcohol abuse or  dependence  Denies BZD abuse - of note, UDS is positive for opiates and for benzodiazepines, patient reports he thinks this is because opiates are sometimes laced with BZDs  Consequences of Substance Abuse: Family, social losses  Previous Psychotropic Medications: States he has been on Trazodone and on Ativan in the past  Psychological Evaluations:  No  Past Medical History: States he has been tested negative for viral hepatitis and HIV in the past .  Past Medical History:  Diagnosis Date  . Acid reflux   . Asthma   . Chronic back pain   . Renal disorder    Kidney stone  . Status post dilation of esophageal narrowing     Past Surgical History:  Procedure Laterality Date  . APPENDECTOMY    . RHINOPLASTY    . UPPER ENDOSCOPY W/ ESOPHAGEAL MANOMETRY     Family History: parents alive, separated, has two sisters  Family Psychiatric  History: history of depression , states parent attempted suicide in the past  Tobacco Screening: Have you used any form of tobacco in the last 30 days? (Cigarettes, Smokeless Tobacco, Cigars, and/or Pipes): Yes Tobacco use, Select all that apply: 5 or more cigarettes per day Are you interested in Tobacco Cessation Medications?: Yes, will notify MD for an order Counseled patient on smoking cessation including recognizing danger situations, developing coping skills and basic information about quitting provided: Refused/Declined practical counseling Social History: separated,has a 21 year old daughter, who lives with the mother, states he does not get to see daughter often, currently unemployed, living with mother, but states it is not clear if he will be able to return there after discharge History  Alcohol Use  . Yes    Comment: social     History  Drug Use  . Types: IV, Heroin    Comment: heroin    Additional Social History:      Pain Medications: Denies Prescriptions: Denies Over the Counter: Denies History of alcohol / drug use?: Yes Longest  period of sobriety (when/how long): "weeks" Negative Consequences of Use: Work / Programmer, multimedia, Surveyor, quantity, Personal relationships Name of Substance 1: Heroin 1 - Age of First Use: 33 1 - Amount (size/oz): "not a lot" "Depends on how strong it is" 1 - Frequency: "I don't know"   1 - Duration: ongoing 1 - Last Use / Amount: yesterday morning                  Allergies:  No Known Allergies Lab Results: No results found for this or any previous visit (from the past 48 hour(s)).  Blood Alcohol level:  Lab Results  Component Value Date   ETH <5 08/15/2016   ETH <5 07/22/2016    Metabolic Disorder Labs:  No results found for: HGBA1C, MPG No results found for: PROLACTIN No results found for: CHOL, TRIG, HDL, CHOLHDL, VLDL, LDLCALC  Current Medications: Current Facility-Administered Medications  Medication Dose Route Frequency Provider Last Rate Last Dose  . acetaminophen (TYLENOL) tablet 650 mg  650 mg Oral Q6H PRN Charm Rings, NP      . alum &  mag hydroxide-simeth (MAALOX/MYLANTA) 200-200-20 MG/5ML suspension 30 mL  30 mL Oral Q4H PRN Charm Rings, NP      . cloNIDine (CATAPRES) tablet 0.1 mg  0.1 mg Oral QID Charm Rings, NP   0.1 mg at 08/16/16 2221   Followed by  . [START ON 08/18/2016] cloNIDine (CATAPRES) tablet 0.1 mg  0.1 mg Oral BH-qamhs Charm Rings, NP       Followed by  . [START ON 08/21/2016] cloNIDine (CATAPRES) tablet 0.1 mg  0.1 mg Oral QAC breakfast Charm Rings, NP      . dicyclomine (BENTYL) tablet 20 mg  20 mg Oral Q6H PRN Charm Rings, NP      . gabapentin (NEURONTIN) capsule 200 mg  200 mg Oral TID Charm Rings, NP      . hydrOXYzine (ATARAX/VISTARIL) tablet 25 mg  25 mg Oral Q6H PRN Charm Rings, NP   25 mg at 08/16/16 2221  . loperamide (IMODIUM) capsule 2-4 mg  2-4 mg Oral PRN Charm Rings, NP      . magnesium hydroxide (MILK OF MAGNESIA) suspension 30 mL  30 mL Oral Daily PRN Charm Rings, NP      . methocarbamol (ROBAXIN) tablet 500 mg  500  mg Oral Q8H PRN Charm Rings, NP   500 mg at 08/16/16 2221  . naproxen (NAPROSYN) tablet 500 mg  500 mg Oral BID PRN Charm Rings, NP      . nicotine polacrilex (NICORETTE) gum 2 mg  2 mg Oral PRN Craige Cotta, MD      . ondansetron (ZOFRAN-ODT) disintegrating tablet 4 mg  4 mg Oral Q6H PRN Charm Rings, NP      . traZODone (DESYREL) tablet 100 mg  100 mg Oral QHS PRN Charm Rings, NP   100 mg at 08/16/16 2221   PTA Medications: Prescriptions Prior to Admission  Medication Sig Dispense Refill Last Dose  . cloNIDine (CATAPRES) 0.1 MG tablet Take 1 tablet (0.1 mg total) by mouth 3 (three) times daily. (Patient not taking: Reported on 08/15/2016) 15 tablet 0 Not Taking at Unknown time  . cyclobenzaprine (FLEXERIL) 10 MG tablet Take 1 tablet (10 mg total) by mouth 3 (three) times daily as needed for muscle spasms. (Patient not taking: Reported on 08/15/2016) 15 tablet 0 Not Taking at Unknown time  . Fluticasone-Salmeterol (ADVAIR) 250-50 MCG/DOSE AEPB Inhale 1 puff into the lungs every 12 (twelve) hours as needed (shortness of breath).    unknown  . gabapentin (NEURONTIN) 100 MG capsule Take 1 capsule (100 mg total) by mouth 2 (two) times daily. (Patient not taking: Reported on 08/15/2016) 60 capsule 0 Not Taking at Unknown time  . gabapentin (NEURONTIN) 300 MG capsule Take 2 capsules (600 mg total) by mouth at bedtime. (Patient not taking: Reported on 08/15/2016) 60 capsule 0 Not Taking at Unknown time  . QUEtiapine (SEROQUEL) 50 MG tablet Take 3 tablets (150 mg total) by mouth at bedtime. (Patient not taking: Reported on 08/15/2016) 90 tablet 0 Not Taking at Unknown time  . traZODone (DESYREL) 100 MG tablet Take 1 tablet (100 mg total) by mouth at bedtime. (Patient not taking: Reported on 08/15/2016) 30 tablet 0 Not Taking at Unknown time  . valACYclovir (VALTREX) 1000 MG tablet Take 1,000 mg by mouth 2 (two) times daily as needed (outbreak).   Past Week at Unknown time    Musculoskeletal: Strength  & Muscle Tone: within normal limits Gait &  Station: normal Patient leans: N/A  Psychiatric Specialty Exam: Physical Exam  Review of Systems  Constitutional: Positive for malaise/fatigue.  HENT: Negative.   Eyes: Negative.   Respiratory: Negative.   Cardiovascular: Negative.   Gastrointestinal: Positive for nausea. Negative for vomiting.  Genitourinary: Negative.   Musculoskeletal: Positive for myalgias.  Skin: Negative.        Has some superficial scratches on face and on forearm, states this is related to picking skin during opiate intoxication /WDL  Neurological: Negative for seizures.  Endo/Heme/Allergies: Negative.   Psychiatric/Behavioral: Positive for depression and substance abuse.  All other systems reviewed and are negative.   Blood pressure 126/65, pulse 84, temperature 98.1 F (36.7 C), temperature source Oral, resp. rate 16, height 6\' 1"  (1.854 m), weight 180 lb (81.6 kg), SpO2 100 %.Body mass index is 23.75 kg/m.  General Appearance: Fairly Groomed  Eye Contact:  Fair  Speech:  Normal Rate  Volume:  Normal  Mood:  Depressed and Dysphoric  Affect:  Constricted  Thought Process:  Linear  Orientation:  Full (Time, Place, and Person)  Thought Content:  denies hallucinations, no delusions , not internally preoccupied   Suicidal Thoughts:  No at this time denies suicidal ideations , denies self injurious ideations   Homicidal Thoughts:  No denies   Memory:  recent and remote grossly intact   Judgement:  Fair  Insight:  Fair  Psychomotor Activity:  decreased , no tremors, no restlessness   Concentration:  Concentration: Good and Attention Span: Good  Recall:  Good  Fund of Knowledge:  Good  Language:  Good  Akathisia:  Negative  Handed:  Right  AIMS (if indicated):     Assets:  Desire for Improvement Resilience  ADL's:  Intact  Cognition:  WNL  Sleep:  Number of Hours: 6.5    Treatment Plan Summary: Daily contact with patient to assess and evaluate  symptoms and progress in treatment, Medication management, Plan inpatient admission , medication treatment as below  and as below  Observation Level/Precautions:  15 minute checks  Laboratory:  as needed - discussed with patient checking HIV and viral hepatitis status, but states he was tested negative in the past, declines   Psychotherapy:milieu, support     Medications:  Opiate detox protocol with Clonidine, Increase Trazodone to 150 mgrs QHS for insomnia as needed , Neurontin 200 mgrs TID for anxiety and pain, Vistaril PRNs for anxiety   Consultations:  As needed   Discharge Concerns:  Patient encouraged to consider rehab setting as disposition plan, he is currently ambivalent but states he will consider   Estimated LOS: 5 days   Other:     Physician Treatment Plan for Primary Diagnosis:  Opiate Dependence  Long Term Goal(s): Improvement in symptoms so as ready for discharge  Short Term Goals: Ability to identify triggers associated with substance abuse/mental health issues will improve  Physician Treatment Plan for Secondary Diagnosis: Active Problems:   Major depressive disorder, recurrent episode, severe, with psychosis (HCC)  Long Term Goal(s): improved coping skills and strategies to work on relapse prevention, sobriety   Short Term Goals: Ability to identify triggers associated with substance abuse/mental health issues will improve  I certify that inpatient services furnished can reasonably be expected to improve the patient's condition.    Nehemiah MassedOBOS, Arav Bannister, MD 9/5/20176:20 PM

## 2016-08-17 NOTE — Tx Team (Signed)
Interdisciplinary Treatment and Diagnostic Plan Update  08/17/2016 Time of Session: 9:30am JADARRIUS MASELLI MRN: [id number]  Principal Diagnosis: Opioid use disorder, severe, dependence ([place])  Secondary Diagnoses: Active Problems:   Major depressive disorder, recurrent episode, severe, with psychosis ([place])   Current Medications:  Current Facility-Administered Medications  Medication Dose Route Frequency Provider Last Rate Last Dose  . acetaminophen (TYLENOL) tablet 650 mg  650 mg Oral Q6H PRN Patrecia Pour, NP      . alum & mag hydroxide-simeth (MAALOX/MYLANTA) 200-200-20 MG/5ML suspension 30 mL  30 mL Oral Q4H PRN Patrecia Pour, NP      . cloNIDine (CATAPRES) tablet 0.1 mg  0.1 mg Oral QID Patrecia Pour, NP   0.1 mg at 08/16/16 2221   Followed by  . [START ON 08/18/2016] cloNIDine (CATAPRES) tablet 0.1 mg  0.1 mg Oral BH-qamhs Patrecia Pour, NP       Followed by  . [START ON 08/21/2016] cloNIDine (CATAPRES) tablet 0.1 mg  0.1 mg Oral QAC breakfast Patrecia Pour, NP      . dicyclomine (BENTYL) tablet 20 mg  20 mg Oral Q6H PRN Patrecia Pour, NP      . gabapentin (NEURONTIN) capsule 200 mg  200 mg Oral TID Patrecia Pour, NP      . hydrOXYzine (ATARAX/VISTARIL) tablet 25 mg  25 mg Oral Q6H PRN Patrecia Pour, NP   25 mg at 08/16/16 2221  . loperamide (IMODIUM) capsule 2-4 mg  2-4 mg Oral PRN Patrecia Pour, NP      . magnesium hydroxide (MILK OF MAGNESIA) suspension 30 mL  30 mL Oral Daily PRN Patrecia Pour, NP      . methocarbamol (ROBAXIN) tablet 500 mg  500 mg Oral Q8H PRN Patrecia Pour, NP   500 mg at 08/16/16 2221  . naproxen (NAPROSYN) tablet 500 mg  500 mg Oral BID PRN Patrecia Pour, NP      . nicotine polacrilex (NICORETTE) gum 2 mg  2 mg Oral PRN Jenne Campus, MD      . ondansetron (ZOFRAN-ODT) disintegrating tablet 4 mg  4 mg Oral Q6H PRN Patrecia Pour, NP      . traZODone (DESYREL) tablet 100 mg  100 mg Oral QHS PRN Patrecia Pour, NP   100 mg at 08/16/16 2221   PTA  Medications: Prescriptions Prior to Admission  Medication Sig Dispense Refill Last Dose  . cloNIDine (CATAPRES) 0.1 MG tablet Take 1 tablet (0.1 mg total) by mouth 3 (three) times daily. (Patient not taking: Reported on 08/15/2016) 15 tablet 0 Not Taking at Unknown time  . cyclobenzaprine (FLEXERIL) 10 MG tablet Take 1 tablet (10 mg total) by mouth 3 (three) times daily as needed for muscle spasms. (Patient not taking: Reported on 08/15/2016) 15 tablet 0 Not Taking at Unknown time  . Fluticasone-Salmeterol (ADVAIR) 250-50 MCG/DOSE AEPB Inhale 1 puff into the lungs every 12 (twelve) hours as needed (shortness of breath).    unknown  . gabapentin (NEURONTIN) 100 MG capsule Take 1 capsule (100 mg total) by mouth 2 (two) times daily. (Patient not taking: Reported on 08/15/2016) 60 capsule 0 Not Taking at Unknown time  . gabapentin (NEURONTIN) 300 MG capsule Take 2 capsules (600 mg total) by mouth at bedtime. (Patient not taking: Reported on 08/15/2016) 60 capsule 0 Not Taking at Unknown time  . QUEtiapine (SEROQUEL) 50 MG tablet Take 3 tablets (150 mg total) by mouth at bedtime. (  Patient not taking: Reported on 08/15/2016) 90 tablet 0 Not Taking at Unknown time  . traZODone (DESYREL) 100 MG tablet Take 1 tablet (100 mg total) by mouth at bedtime. (Patient not taking: Reported on 08/15/2016) 30 tablet 0 Not Taking at Unknown time  . valACYclovir (VALTREX) 1000 MG tablet Take 1,000 mg by mouth 2 (two) times daily as needed (outbreak).   Past Week at Unknown time    Treatment Modalities: Medication Management, Group therapy, Case management,  1 to 1 session with clinician, Psychoeducation, Recreational therapy.   Physician Treatment Plan for Primary Diagnosis: Opioid use disorder, severe, dependence ([place]) Long Term Goal(s): Improvement in symptoms so as ready for discharge   Short Term Goals: Ability to identify changes in lifestyle to reduce recurrence of condition will improve, Ability to demonstrate self-control  will improve and Compliance with prescribed medications will improve  Medication Management: Evaluate patient's response, side effects, and tolerance of medication regimen.  Therapeutic Interventions: 1 to 1 sessions, Unit Group sessions and Medication administration.  Evaluation of Outcomes: Not Met  Physician Treatment Plan for Secondary Diagnosis: Active Problems:   Major depressive disorder, recurrent episode, severe, with psychosis ([place])  Long Term Goal(s): Improvement in symptoms so as ready for discharge  Short Term Goals: Ability to identify changes in lifestyle to reduce recurrence of condition will improve, Ability to demonstrate self-control will improve and Compliance with prescribed medications will improve  Medication Management: Evaluate patient's response, side effects, and tolerance of medication regimen.  Therapeutic Interventions: 1 to 1 sessions, Unit Group sessions and Medication administration.  Evaluation of Outcomes: Not Met   RN Treatment Plan for Primary Diagnosis: Opioid use disorder, severe, dependence ([place]) Long Term Goal(s): Knowledge of disease and therapeutic regimen to maintain health will improve  Short Term Goals: Ability to verbalize frustration and anger appropriately will improve, Ability to demonstrate self-control and Ability to identify and develop effective coping behaviors will improve  Medication Management: RN will administer medications as ordered by provider, will assess and evaluate patient's response and provide education to patient for prescribed medication. RN will report any adverse and/or side effects to prescribing provider.  Therapeutic Interventions: 1 on 1 counseling sessions, Psychoeducation, Medication administration, Evaluate responses to treatment, Monitor vital signs and CBGs as ordered, Perform/monitor CIWA, COWS, AIMS and Fall Risk screenings as ordered, Perform wound care treatments as ordered.  Evaluation of Outcomes:  Not Met   LCSW Treatment Plan for Primary Diagnosis: Opioid use disorder, severe, dependence ([place]) Long Term Goal(s): Safe transition to appropriate next level of care at discharge, Engage patient in therapeutic group addressing interpersonal concerns.  Short Term Goals: Engage patient in aftercare planning with referrals and resources, Increase ability to appropriately verbalize feelings and Facilitate acceptance of mental health diagnosis and concerns  Therapeutic Interventions: Assess for all discharge needs, 1 to 1 time with Social worker, Explore available resources and support systems, Assess for adequacy in community support network, Educate family and significant other(s) on suicide prevention, Complete Psychosocial Assessment, Interpersonal group therapy.  Evaluation of Outcomes: Not Met   Progress in Treatment :  Attending groups: Continuing to assess  Participating in groups: Continuing to assess  Taking medication as prescribed: Yes, MD continuing to assess for appropriate medication regimen  Toleration medication: Yes  Family/Significant other contact made: Treatment team assessing for appropriate contacts  Patient understands diagnosis: Yes  Discussing patient identified problems/goals with staff: Yes  Medical problems stabilized or resolved: Yes  Denies suicidal/homicidal ideation: Treatment team continuing to asses  Issues/concerns per patient self-inventory: None reported  Other: N/A  New problem(s) identified: None reported at this time    New Short Term/Long Term Goal(s): None at this time    Discharge Plan or Barriers: Treatment team continuing to assess.    Reason for Continuation of Hospitalization: Anxiety Depression Medication stabilization Suicidal Ideations Withdrawal symptoms  Estimated Length of Stay: 3-5 days    Attendees:  Patient:                        Physician: Dr. Parke Poisson & Dr. Shea Evans , MD  08/16/2016   9:30am  Nursing:  Raynelle Jan, RN  08/16/2016 9:30am  RN Care Manager:   Social Workers: Erasmo Downer Willis Kuipers, LCSW, Peri Maris, [place] 08/16/2016 9:30am  Nurse Pratictioners: Samuel Jester, NP 08/16/16   Scribe for Treatment Team: Tilden Fossa, [place] Worker [facility] [phone]

## 2016-08-17 NOTE — Progress Notes (Signed)
Recreation Therapy Notes  Animal-Assisted Activity (AAA) Program Checklist/Progress Notes Patient Eligibility Criteria Checklist & Daily Group note for Rec TxIntervention  Date: 09.05.2017 Time: 2:45pm [address]    AAA/T Program Assumption of Risk Form signed by Patient/ or Parent Legal Guardian Yes  Patient is free of allergies or sever asthma Yes  Patient reports no fear of animals Yes  Patient reports no history of cruelty to animals Yes  Patient understands his/her participation is voluntary Yes  Behavioral Response: Did not attend.  Marykay Lexenise L Thalya Fouche, LRT/CTRS  Damacio Weisgerber L 08/17/2016 2:55 PM

## 2016-08-17 NOTE — Progress Notes (Signed)
Adult Psychoeducational Group Note  Date:  08/17/2016 Time:  9:23 PM  Group Topic/Focus:  Wrap-Up Group:   The focus of this group is to help patients review their daily goal of treatment and discuss progress on daily workbooks.   Participation Level:  Did Not Attend  Participation Quality:  Did not attend  Affect:  Did not attend  Cognitive:  Did not attend  Insight: None  Engagement in Group:  Did not attend  Modes of Intervention:  Did not attend  Additional Comments:  Patient did not attend wrap up group tonight. Siah Kannan L Tomeca Helm 08/17/2016, 9:23 PM

## 2016-08-18 DIAGNOSIS — F1120 Opioid dependence, uncomplicated: Secondary | ICD-10-CM

## 2016-08-18 MED ORDER — TRAZODONE HCL 100 MG PO TABS
100.0000 mg | ORAL_TABLET | Freq: Once | ORAL | Status: AC
  Administered 2016-08-18: 100 mg via ORAL
  Filled 2016-08-18 (×2): qty 1

## 2016-08-18 MED ORDER — VALACYCLOVIR HCL 500 MG PO TABS
1000.0000 mg | ORAL_TABLET | Freq: Two times a day (BID) | ORAL | Status: DC
  Administered 2016-08-18 – 2016-08-19 (×2): 1000 mg via ORAL
  Filled 2016-08-18 (×7): qty 2

## 2016-08-18 MED ORDER — TRAZODONE HCL 150 MG PO TABS
150.0000 mg | ORAL_TABLET | Freq: Every day | ORAL | Status: DC
Start: 2016-08-18 — End: 2016-08-19
  Administered 2016-08-18: 150 mg via ORAL
  Filled 2016-08-18 (×4): qty 1

## 2016-08-18 NOTE — Progress Notes (Signed)
Patient ID: Toribio HarbourMark J Mcnerney, male   DOB: [birth year], 35 y.o.   MRN: [id number] Patient came up to nurse's station to ask for his nurse.  Requested nicorette gum which he was given.  Patient states, "I don't want any medicine.  I only want to take something at night to knock me out.  I just want to stay to myself and get out."  Patient is agitated and anxious.  He requested to speak to MD.  I informed him that MD was in a meeting and I would let him know afterwards.  Patient insisted that he needed to see him today even though he was seen this morning by a NP.  I informed Dr. Jama Flavorsobos of patient's mood and his request for discharge in the am.  Dr. Jama Flavorsobos stated he would try and stop by and talk with him, however, it would probably be Friday before he would be discharged.  I informed the patient and he stated, "that is not good.  That is not good at all."  Patient requested his medications and I gave him his gabapentin.  He also requested valtrex and order was given per Dr. Jama Flavorsobos.  Patient is currently in day room.

## 2016-08-18 NOTE — Progress Notes (Signed)
D: Pt denies SI/HI/AVH. Pt is pleasant and cooperative. Pt stated he was doing fine and ready to go. Pt signed voluntary consent this evening per Dr. Jama Flavorsobos .   A: Pt was offered support and encouragement. Pt was given scheduled medications. Pt was encourage to attend groups. Q 15 minute checks were done for safety.   R:Pt attends groups and interacts well with peers and staff. Pt is taking medication. Pt has no complaints.Pt receptive to treatment and safety maintained on unit.

## 2016-08-18 NOTE — Progress Notes (Signed)
Per verbal order from Dr. Jama Flavorsobos, pt signed voluntary consent paperwork

## 2016-08-18 NOTE — Progress Notes (Signed)
[facility]  08/18/2016 4:00 PM Toribio HarbourMark J Segoviano  MRN:  [id number]  Subjective: [name] reports, "I'm not sleeping at night. I feel restless today".  Objective: [name] is seen, chart reviewed. He is alert & oriented. He receiving Opioid detoxification treatment. He is endorsing insomnia. He is complaining of restlessness due to lack of sleep at night. His PRN Trazodone 150 mg has been changes to routine Q has. He is encouraged to participate in the group milieu.  Principal Problem: Opioid use disorder, severe, dependence (HCC)  Diagnosis:   Patient Active Problem List   Diagnosis Date Noted  . Major depressive disorder, recurrent episode, severe, with psychosis (HCC) [F33.3] 08/16/2016  . Substance induced mood disorder (HCC) [F19.94] 07/22/2016  . Alcohol abuse [F10.10] 07/22/2016  . Involuntary commitment [Z04.6] 07/22/2016  . 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 Opioid use disorder, severe, dependence (HCC) [F11.20] 03/20/2016  . Tobacco use disorder [F17.200] 03/20/2016   Total Time spent with patient: 25 minutes  Past Psychiatric History: Opioid & alcohol use disorders  Past Medical History:  Past Medical History:  Diagnosis Date  . Acid reflux   . Asthma   . Chronic back pain   . Renal disorder    Kidney stone  . Status post dilation of esophageal narrowing     Past Surgical History:  Procedure Laterality Date  . APPENDECTOMY    . RHINOPLASTY    . UPPER ENDOSCOPY W/ ESOPHAGEAL MANOMETRY     Family History: History reviewed. No pertinent family history.  Family Psychiatric  History: See H&P  Social History:  History  Alcohol Use  . Yes    Comment: social     History  Drug Use  . Types: IV, Heroin    Comment: heroin    Social History   Social History  . Marital status: Single    Spouse name: N/A  . Number of children: N/A  . Years of education: N/A   Social History Main Topics  .  Smoking status: Current Every Day Smoker    Packs/day: 0.50    Types: Cigarettes  . Smokeless tobacco: Never Used  . Alcohol use Yes     Comment: social  . Drug use:     Types: IV, Heroin     Comment: heroin  . Sexual activity: Yes    Birth control/ protection: None   Other Topics Concern  . None   Social History Narrative  . None   Additional Social History:    Pain Medications: Denies Prescriptions: Denies Over the Counter: Denies History of alcohol / drug use?: Yes Longest period of sobriety (when/how long): "weeks" Negative Consequences of Use: Work / Programmer, multimediachool, Surveyor, quantityinancial, Personal relationships Name of Substance 1: Heroin 1 - Age of First Use: 33 1 - Amount (size/oz): "not a lot" "Depends on how strong it is" 1 - Frequency: "I don't know"   1 - Duration: ongoing 1 - Last Use / Amount: yesterday morning  Sleep: Fair  Appetite:  Fair  Current Medications: Current Facility-Administered Medications  Medication Dose Route Frequency Provider Last Rate Last Dose  . acetaminophen (TYLENOL) tablet 650 mg  650 mg Oral Q6H PRN Charm RingsJamison Y Lord, NP      . alum & mag hydroxide-simeth (MAALOX/MYLANTA) 200-200-20 MG/5ML suspension 30 mL  30 mL Oral Q4H PRN Charm RingsJamison Y Lord, NP      . cloNIDine (CATAPRES) tablet 0.1 mg  0.1 mg Oral QID Herminio HeadsJamison Y  Lord, NP   0.1 mg at 08/17/16 2108   Followed by  . cloNIDine (CATAPRES) tablet 0.1 mg  0.1 mg Oral BH-qamhs Charm Rings, NP       Followed by  . [START ON 08/21/2016] cloNIDine (CATAPRES) tablet 0.1 mg  0.1 mg Oral QAC breakfast Charm Rings, NP      . dicyclomine (BENTYL) tablet 20 mg  20 mg Oral Q6H PRN Charm Rings, NP      . gabapentin (NEURONTIN) capsule 200 mg  200 mg Oral TID Charm Rings, NP   200 mg at 08/18/16 1514  . hydrOXYzine (ATARAX/VISTARIL) tablet 25 mg  25 mg Oral Q6H PRN Charm Rings, NP   25 mg at 08/17/16 2107  . loperamide (IMODIUM) capsule 2-4 mg  2-4 mg Oral PRN Charm Rings, NP      . magnesium hydroxide  (MILK OF MAGNESIA) suspension 30 mL  30 mL Oral Daily PRN Charm Rings, NP      . methocarbamol (ROBAXIN) tablet 500 mg  500 mg Oral Q8H PRN Charm Rings, NP   500 mg at 08/17/16 2108  . naproxen (NAPROSYN) tablet 500 mg  500 mg Oral BID PRN Charm Rings, NP   500 mg at 08/18/16 1514  . nicotine polacrilex (NICORETTE) gum 2 mg  2 mg Oral PRN Craige Cotta, MD   2 mg at 08/18/16 1510  . ondansetron (ZOFRAN-ODT) disintegrating tablet 4 mg  4 mg Oral Q6H PRN Charm Rings, NP      . traZODone (DESYREL) tablet 150 mg  150 mg Oral QHS PRN Craige Cotta, MD   150 mg at 08/17/16 2107  . valACYclovir (VALTREX) tablet 1,000 mg  1,000 mg Oral BID Craige Cotta, MD       Lab Results: No results found for this or any previous visit (from the past 48 hour(s)).  Blood Alcohol level:  Lab Results  Component Value Date   ETH <5 08/15/2016   ETH <5 07/22/2016   Metabolic Disorder Labs: No results found for: HGBA1C, MPG No results found for: PROLACTIN No results found for: CHOL, TRIG, HDL, CHOLHDL, VLDL, LDLCALC  Physical Findings: AIMS: Facial and Oral Movements Muscles of Facial Expression: None, normal Lips and Perioral Area: None, normal Jaw: None, normal Tongue: None, normal,Extremity Movements Upper (arms, wrists, hands, fingers): None, normal Lower (legs, knees, ankles, toes): None, normal, Trunk Movements Neck, shoulders, hips: None, normal, Overall Severity Severity of abnormal movements (highest score from questions above): None, normal Incapacitation due to abnormal movements: None, normal Patient's awareness of abnormal movements (rate only patient's report): No Awareness, Dental Status Current problems with teeth and/or dentures?: No Does patient usually wear dentures?: No  CIWA:    COWS:  COWS Total Score: 5  Musculoskeletal: Strength & Muscle Tone: within normal limits Gait & Station: normal Patient leans: N/A  Psychiatric Specialty Exam: Physical Exam   Constitutional: He appears well-developed.  HENT:  Head: Normocephalic.  Eyes: Pupils are equal, round, and reactive to light.  Neck: Normal range of motion.  Cardiovascular: Normal rate.   Respiratory: Effort normal.  GI: Soft.  Genitourinary:  Genitourinary Comments: Denies any issues in this area  Musculoskeletal: Normal range of motion.  Neurological: He is alert.    Review of Systems  Constitutional: Negative.   HENT: Negative.   Eyes: Negative.   Respiratory: Negative.   Cardiovascular: Negative.   Gastrointestinal: Negative.   Genitourinary: Negative.  Musculoskeletal: Positive for joint pain and myalgias.  Skin: Negative.   Neurological: Negative.   Endo/Heme/Allergies: Negative.   Psychiatric/Behavioral: Positive for depression and substance abuse. Negative for hallucinations, memory loss and suicidal ideas. The patient is nervous/anxious and has insomnia.     Blood pressure 138/83, pulse (!) 116, temperature 98.2 F (36.8 C), temperature source Oral, resp. rate 18, height 6\' 1"  (1.854 m), weight 81.6 kg (180 lb), SpO2 100 %.Body mass index is 23.75 kg/m.    General Appearance: Fairly Groomed  Eye Contact:  Fair  Speech:  Normal Rate  Volume:  Normal  Mood:  Depressed and Dysphoric  Affect:  Constricted  Thought Process:  Linear  Orientation:  Full (Time, Place, and Person)  Thought Content:  denies hallucinations, no delusions , not internally preoccupied   Suicidal Thoughts:  No at this time denies suicidal ideations , denies self injurious ideations   Homicidal Thoughts:  No denies   Memory:  recent and remote grossly intact   Judgement:  Fair  Insight:  Fair  Psychomotor Activity:  decreased , no tremors, no restlessness   Concentration:  Concentration: Good and Attention Span: Good  Recall:  Good  Fund of Knowledge:  Good  Language:  Good  Akathisia:  Negative  Handed:  Right  AIMS (if indicated):     Assets:  Desire for Improvement Resilience   ADL's:  Intact  Cognition:  WNL  Sleep:  Number of Hours: 6.5     Treatment Plan Summary: Daily contact with patient to assess and evaluate symptoms and progress in treatment and Medication management: Opioid withdrawal symptoms: Continue the Clonidine detox protocols. Agitation: Continue the Gabapentin 200 mg. Insomnia: Changes Trazodone 150 mg to routine Q hs. - Continue 15 minutes observation for safety concerns - Encouraged to participate in milieu therapy and group therapy counseling sessions and also work with coping skills -  Develop treatment plan to decrease risk of relapse upon discharge and to reduce the need for readmission. -  Psycho-social education regarding relapse prevention and self care. - Health care follow up as needed for medical problems. - Restart home medications where appropriate.  Sanjuana Kava, NP, PMHNP, FNP-BC 08/18/2016, 4:00 PM   Agree with NP progress note as above  Sallyanne Havers, MD

## 2016-08-18 NOTE — Progress Notes (Signed)
Scott Brock. [name] had been in room and in bed for much of the evening, did not attend evening group activity. [name] spoke about how he is not feeling well, reports feelings of anxiety and some generalize achiness and also spoke about not sleeping well. [name] asked for and received various medications to assist with anxiety and insomnia this evening. A. Support provided, medication education given. R. Pt verbalized understanding, safety maintained.

## 2016-08-18 NOTE — Progress Notes (Signed)
Pt attended NA group this evening.  

## 2016-08-18 NOTE — [facility] Suicide Risk Assessment (Signed)
[facility] INPATIENT:  Family/Significant Other Suicide Prevention Education  Suicide Prevention Education:  Patient Refusal for Family/Significant Other Suicide Prevention Education: The patient Scott Brock has refused to provide written consent for family/significant other to be provided Family/Significant Other Suicide Prevention Education during admission and/or prior to discharge.  Physician notified. SPE reviewed with patient and brochure provided. Patient encouraged to return to hospital if having suicidal thoughts, patient verbalized his/her understanding and has no further questions at this time.   Timo Hartwig L Adelaine Roppolo 08/18/2016, 12:45 PM

## 2016-08-18 NOTE — Plan of Care (Signed)
Problem: Activity: Goal: Interest or engagement in activities will improve Outcome: Not Progressing Patient refuses to attend any unit activities and isolates to room.

## 2016-08-18 NOTE — [facility] Group Notes (Signed)
[facility] LCSW Group Therapy 08/18/2016  1:15 PM   Type of Therapy: Group Therapy  Participation Level: Did Not Attend. Patient invited to participate but declined.   Johnmatthew Solorio, MSW, LCSW Clinical Social Worker [facility] [phone]   

## 2016-08-18 NOTE — Progress Notes (Signed)
Recreation Therapy Notes  Date: 08/18/16 Time: 0930 Location: [address] Group Room  Group Topic: Stress Management  Goal Area(s) Addresses:  Patient will verbalize importance of using healthy stress management.  Patient will identify positive emotions associated with healthy stress management.   Intervention: Stress Management  Activity :  Progressive Muscle Relaxation.  LRT introduced the technique of progressive muscle relaxation to patients.  LRT read script so patients to engage in technique.  Patients were to sit in a comfortable position and follow along as LRT read script.  Education:  Stress Management, Discharge Planning.   Education Outcome: Needs additional education  Clinical Observations/Feedback: Pt did not attend group.    Caroll RancherMarjette Krystalyn Kubota, LRT/CTRS         Caroll RancherLindsay, Dal Blew A 08/18/2016 12:31 PM

## 2016-08-18 NOTE — Progress Notes (Signed)
Patient ID: Scott Brock, male   DOB: 1981/10/12, 35 y.o.   MRN: [id number] D: Patient remains isolative to room.  He refuses to take any medication or engage in any unit activities.  He denies any thoughts of self harm. He has not complained of any significant withdrawal symptoms.  He denies HI/AVH.  His appearance is disheveled and hygiene is poor.   A: Continue to monitor medication management and MD orders.  Safety checks completed every 15 minutes per protocol.  Offer support and encouragement as needed. R: Patient has no interaction with staff and peers.

## 2016-08-19 MED ORDER — NICOTINE POLACRILEX 2 MG MT GUM: 2.0000 mg | CHEWING_GUM | OROMUCOSAL | 0 refills | Status: DC | PRN

## 2016-08-19 MED ORDER — TRAZODONE HCL 150 MG PO TABS: 150.0000 mg | ORAL_TABLET | Freq: Every day | ORAL | 0 refills | Status: DC

## 2016-08-19 MED ORDER — HYDROXYZINE HCL 25 MG PO TABS: 25.0000 mg | ORAL_TABLET | Freq: Four times a day (QID) | ORAL | 0 refills | Status: DC | PRN

## 2016-08-19 MED ORDER — VALACYCLOVIR HCL 1 G PO TABS: 1000.0000 mg | ORAL_TABLET | Freq: Two times a day (BID) | ORAL | Status: DC | PRN

## 2016-08-19 MED ORDER — GABAPENTIN 100 MG PO CAPS: 200.0000 mg | ORAL_CAPSULE | Freq: Three times a day (TID) | ORAL | 0 refills | Status: DC

## 2016-08-19 NOTE — Clinical Social Work Note (Signed)
Patient given list of [facility] and [address].  Encouraged to call.  Santa GeneraAnne Adjoa Althouse, LCSW Lead Clinical Social Worker Phone:  [phone]

## 2016-08-19 NOTE — Progress Notes (Signed)
Discharge note: Pt received both written and verbal discharge instructions. Pt verbalized understanding of discharge instructions. Pt agreed to f/u appt and med regimen. Pt received prescriptions, SRA, AVS and transition record. Pt received belongings from room and locker. Pt safely discharged to lobby.

## 2016-08-19 NOTE — Discharge Summary (Signed)
Physician Discharge Summary Note  Patient:  Scott Brock is an 35 y.o., male MRN:  [id number] DOB:  1981-01-14 Patient phone:  [phone] (home)  Patient address:   [address] [place] [place] [id number],  Total Time spent with patient: Greater than 30 minutes  Date of Admission:  08/16/2016  Date of Discharge: 08-19-16  Reason for Admission: Suicidal ideations & Opioid withdrawal symptoms.  Principal Problem: Opioid use disorder, severe, dependence [facility](HCC)  Discharge Diagnoses: Patient Active Problem List   Diagnosis Date Noted  . Major depressive disorder, recurrent episode, severe, with psychosis (HCC) [F33.3] 08/16/2016  . Substance induced mood disorder (HCC) [F19.94] 07/22/2016  . Alcohol abuse [F10.10] 07/22/2016  . Involuntary commitment [Z04.6] 07/22/2016  . 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 Opioid use disorder, severe, dependence (HCC) [F11.20] 03/20/2016  . Tobacco use disorder [F17.200] 03/20/2016   Past Psychiatric History: Major depression, opioid use disorder, dependence, Alcohol use disorder.  Past Medical History:  Past Medical History:  Diagnosis Date  . Acid reflux   . Asthma   . Chronic back pain   . Renal disorder    Kidney stone  . Status post dilation of esophageal narrowing     Past Surgical History:  Procedure Laterality Date  . APPENDECTOMY    . RHINOPLASTY    . UPPER ENDOSCOPY W/ ESOPHAGEAL MANOMETRY     Family History: History reviewed. No pertinent family history.  Family Psychiatric  History: See H&P  Social History:  History  Alcohol Use  . Yes    Comment: social     History  Drug Use  . Types: IV, Heroin    Comment: heroin    Social History   Social History  . Marital status: Single    Spouse name: N/A  . Number of children: N/A  . Years of education: N/A   Social History Main Topics  . Smoking status: Current Every Day Smoker    Packs/day: 0.50     Types: Cigarettes  . Smokeless tobacco: Never Used  . Alcohol use Yes     Comment: social  . Drug use:     Types: IV, Heroin     Comment: heroin  . Sexual activity: Yes    Birth control/ protection: None   Other Topics Concern  . None   Social History Narrative  . None   Hospital Course: 35 year old male, history of opiate dependence, has been abusing heroin as substance of choice, using daily. Denies IVDA (insufflates). Patient was admitted under commitment generated by father, reporting that patient was making suicidal statements and threatening others, being belligerent . Patient minimizes the above , states " he just had to say that stuff to make sure I would get admitted ", but does endorse he has been depressed, sad, which he attributes to his substance abuse . At this time reports symptoms of opiate WDL , mainly diffuse aches, a sense of malaise, rhinorrhea, some nausea. Denies vomiting . States " I think I am over the hump of the withdrawal ".  Scott [facility] worsening symptoms of depression, crisis management due to suicidal ideations & opioid withdrawal symptoms. Admission reports also indicated that Scott Brock was being belligerent & threatening other people. He was admitted to the hospital for opioid detoxification & mood stabilization treatments. After admission assessment, his presenting symptoms were identified. The medication management targeting those symptoms were initiated. Scott Brock received Clonidine detoxification protocols. He  was also medicated & discharged on; Gabapentin 200 mg for agitation, Hydroxyzine 25 mg prn for anxiety, Nicorette gum for smoking cessation & Trazodone 150 mg for insomnia. Scott Brock presented other significant pre-existing medical problems that required treatment or monitoring. He was resumed on all his pertinent home medications for those health issues. He tolerated her treatment regimen without any adverse effects reported.  During the course of  his hospitalization & as his treatment progressed, Zakye's improvement was monitored & noted by observation of his daily reports of symptom reduction. His emotional & mental status were also monitored by daily self-inventory assessment reports completed by him & the clinical staff. He was evaluated by the treatment team for stability and plans for continued recovery after discharge. Darrien's motivation was an integral factor in her mood stability. She was recommended further treatment options upon discharge by referring & scheduling her an outpatient psychiatric clinic for follow-up visits & medication managment as listed below. He was enrolled & participated in the group counseling sessions being offered & held on this unit. He learned coping skills.    Upon her hospital discharge, Scott Brock was both mentally and medically stable for discharge denying SIHI, auditory/visual/tactile hallucinations, delusional thoughts & or paranoia. She was provided with a 7 days worth supply samples of her [facility] discharge medications. He left [facility] in no apparent distress. Transportation per mother.  Physical Findings: AIMS: Facial and Oral Movements Muscles of Facial Expression: None, normal Lips and Perioral Area: None, normal Jaw: None, normal Tongue: None, normal,Extremity Movements Upper (arms, wrists, hands, fingers): None, normal Lower (legs, knees, ankles, toes): None, normal, Trunk Movements Neck, shoulders, hips: None, normal, Overall Severity Severity of abnormal movements (highest score from questions above): None, normal Incapacitation due to abnormal movements: None, normal Patient's awareness of abnormal movements (rate only patient's report): No Awareness, Dental Status Current problems with teeth and/or dentures?: No Does patient usually wear dentures?: No  CIWA:    COWS:  COWS Total Score: 0  Musculoskeletal: Strength & Muscle Tone: within normal limits Gait & Station: normal Patient leans:  N/A  Psychiatric Specialty Exam: Physical Exam  Constitutional: He appears well-developed.  HENT:  Head: Normocephalic.  Eyes: Pupils are equal, round, and reactive to light.  Neck: Normal range of motion.  Cardiovascular: Normal rate.   Respiratory: Effort normal.  GI: Soft.  Genitourinary:  Genitourinary Comments: Denies any issues  Musculoskeletal: Normal range of motion.  Neurological: He is alert.  Skin: Skin is warm.    Review of Systems  Constitutional: Negative.   HENT: Negative.   Eyes: Negative.   Respiratory: Negative.   Cardiovascular: Negative.   Gastrointestinal: Negative.   Genitourinary: Negative.   Musculoskeletal: Negative.   Skin: Negative.   Neurological: Negative.   Endo/Heme/Allergies: Negative.   Psychiatric/Behavioral: Positive for depression (Stable) and substance abuse (Opioid use disorder). Negative for hallucinations, memory loss and suicidal ideas. The patient has insomnia (Stable). The patient is not nervous/anxious.     Blood pressure 116/80, pulse 86, temperature 98 F (36.7 C), temperature source Oral, resp. rate 16, height 6\' 1"  (1.854 m), weight 81.6 kg (180 lb), SpO2 100 %.Body mass index is 23.75 kg/m.  See Md's SRA  Have you used any form of tobacco in the last 30 days? (Cigarettes, Smokeless Tobacco, Cigars, and/or Pipes): Yes  Has this patient used any form of tobacco in the last 30 days? (Cigarettes, Smokeless Tobacco, Cigars, and/or Pipes): Yes, provided with Nicorette gum prescription.  Blood Alcohol level:  Lab Results  Component Value Date   ETH <5 08/15/2016   ETH <5 07/22/2016   Metabolic Disorder Labs:  No results found for: HGBA1C, MPG No results found for: PROLACTIN No results found for: CHOL, TRIG, HDL, CHOLHDL, VLDL, LDLCALC  See Psychiatric Specialty Exam and Suicide Risk Assessment completed by Attending Physician prior to discharge.  Discharge destination:  Home  Is patient on multiple antipsychotic  therapies at discharge:  No   Has Patient had three or more failed trials of antipsychotic monotherapy by history:  No  Recommended Plan for Multiple Antipsychotic Therapies: NA    Medication List    STOP taking these medications   cloNIDine 0.1 MG tablet Commonly known as:  CATAPRES   cyclobenzaprine 10 MG tablet Commonly known as:  FLEXERIL   Fluticasone-Salmeterol 250-50 MCG/DOSE Aepb Commonly known as:  ADVAIR   QUEtiapine 50 MG tablet Commonly known as:  SEROQUEL     TAKE these medications     Indication  gabapentin 100 MG capsule Commonly known as:  NEURONTIN Take 2 capsules (200 mg total) by mouth 3 (three) times daily. For agitation What changed:  how much to take  when to take this  additional instructions  Another medication with the same name was removed. Continue taking this medication, and follow the directions you see here.  Indication:  Agitation   hydrOXYzine 25 MG tablet Commonly known as:  ATARAX/VISTARIL Take 1 tablet (25 mg total) by mouth every 6 (six) hours as needed for anxiety.  Indication:  Anxiety   nicotine polacrilex 2 MG gum Commonly known as:  NICORETTE Take 1 each (2 mg total) by mouth as needed for smoking cessation.  Indication:  Nicotine Addiction   traZODone 150 MG tablet Commonly known as:  DESYREL Take 1 tablet (150 mg total) by mouth at bedtime. For insomnia What changed:  medication strength  how much to take  additional instructions  Indication:  Trouble Sleeping   valACYclovir 1000 MG tablet Commonly known as:  VALTREX Take 1 tablet (1,000 mg total) by mouth 2 (two) times daily as needed (outbreak). Herpes outbreak What changed:  additional instructions  Indication:  Herpes outbreak      Follow-up Information    Patient declines referral for outpatient services. .          Follow-up recommendations:  Activity:  As tolerated Diet: As recommended by your primary care doctor. Keep all scheduled  follow-up appointments as recommended.  Comments: Patient is instructed prior to discharge to: Take all medications as prescribed by his/her mental healthcare provider. Report any adverse effects and or reactions from the medicines to his/her outpatient provider promptly. Patient has been instructed & cautioned: To not engage in alcohol and or illegal drug use while on prescription medicines. In the event of worsening symptoms, patient is instructed to call the crisis hotline, 911 and or go to the nearest ED for appropriate evaluation and treatment of symptoms. To follow-up with his/her primary care provider for your other medical issues, concerns and or health care needs.   Signed: Sanjuana Kava, NP, PMHNP, FNP-BC 08/19/2016, 10:32 AM   Patient seen, Suicide Assessment Completed.  Disposition Plan Reviewed

## 2016-08-19 NOTE — Clinical Social Work Note (Signed)
Patient called father in presence of CSW, would not provide CSW w father's phone # or consent to contact father independently.  Pt explained his plan to "go to [facility] today and I know they'll admit me."  Father wants patient to find out "for sure" if they will admit him prior to picking up patient at discharge.  CSW stressed that MD would need to feel comfortable w patient's discharge plan prior to allowing discharge and that patient has not provided consent for sending records to any other treatment facility.  Pt then stated that "if [facility], Ill just [facility]."  Father expressed agreement w patient's plan of seeking [facility] then [facility].  Patient continues to state "I want to get out of here today, I am going today."  Was unable to state why he did not want to remain as inpatient at current facility.  Also expressed plan to admit to [facility] [place] after clearing charges at traffic court on 9.22.  Father wants return call from patient confirming DC plan, MD made aware.  Santa GeneraAnne Cunningham, LCSW Lead Clinical Social Worker Phone:  [phone]

## 2016-08-19 NOTE — Progress Notes (Addendum)
  [facility] Adult Case Management Discharge Plan :  Will you be returning to the same living situation after discharge:  No, per father, patient is "homeless" and cannot return to mother; father is transporting patient to services patient has requested At discharge, do you have transportation home?: Yes,  father Do you have the ability to pay for your medications: Yes,  has insurance  Release of information consent forms completed and in the chart;  Patient's signature needed at discharge.  Patient to Follow up at: Follow-up Information    Patient declines referral for outpatient services. .           Next level of care provider has access to [facility] Link:no  Safety Planning and Suicide Prevention discussed: Yes,  reviewed w patient as patient refused consent for collateral contact  Have you used any form of tobacco in the last 30 days? (Cigarettes, Smokeless Tobacco, Cigars, and/or Pipes): Yes  Has patient been referred to the Quitline?: Patient refused referral  Patient has been referred for addiction treatment: Pt. refused referral; pt refused for CSW to make referrals for substance use treatment however states that he intends [facility] after appearing in traffic court on 9/22; pt has made his own arrangements and does not want records sent anywhere.    Sallee Langenne C Elka Satterfield 08/19/2016, 9:53 AM

## 2016-08-19 NOTE — [facility] Suicide Risk Assessment (Signed)
[facility] Discharge Suicide Risk Assessment   Principal Problem: Opioid use disorder, severe, dependence [facility]) Discharge Diagnoses:  Patient Active Problem List   Diagnosis Date Noted  . Major depressive disorder, recurrent episode, severe, with psychosis (HCC) [F33.3] 08/16/2016  . Substance induced mood disorder (HCC) [F19.94] 07/22/2016  . Alcohol abuse [F10.10] 07/22/2016  . Involuntary commitment [Z04.6] 07/22/2016  . 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 Opioid use disorder, severe, dependence (HCC) [F11.20] 03/20/2016  . Tobacco use disorder [F17.200] 03/20/2016    Total Time spent with patient: 30 minutes  Musculoskeletal: Strength & Muscle Tone: within normal limits Gait & Station: normal Patient leans: N/A  Psychiatric Specialty Exam: ROS no headache, no chest pain, no vomiting, no diarrhea , no fever, no chills   Blood pressure 116/80, pulse 86, temperature 98 F (36.7 C), temperature source Oral, resp. rate 16, height 6\' 1"  (1.854 m), weight 180 lb (81.6 kg), SpO2 100 %.Body mass index is 23.75 kg/m.  General Appearance: improved grooming   Eye Contact::  Good  Speech:  Normal Rate409  Volume:  Normal  Mood:  states mood is "OK", denies depression  Affect:  Appropriate and reactive, vaguely anxious   Thought Process:  Linear  Orientation:  Full (Time, Place, and Person)  Thought Content:  denies hallucinations,no delusions   Suicidal Thoughts:  No denies any suicidal or self injurious ideations  Homicidal Thoughts:  No denies any homicidal or violent ideations   Memory:  recent and remote grossly intact   Judgement:  Improving   Insight:  improving   Psychomotor Activity:  Normal  Concentration:  Good  Recall:  Good  Fund of Knowledge:Good  Language: Good  Akathisia:  Negative  Handed:  Right  AIMS (if indicated):     Assets:  Communication Skills Desire for Improvement Resilience  Sleep:   Number of Hours: 5  Cognition: WNL  ADL's:  Intact   Mental Status Per Nursing Assessment::   On Admission:  NA  Demographic Factors:  35 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 , Research scientist (life sciences), has a 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, who lives with the mother    Loss Factors: Unemployed, stressed relationship with family due to substance abuse , limited access to her daughter, who lives with the mother   Historical Factors: History of Opiate Dependence , history of depression  Risk Reduction Factors:   Sense of responsibility to family and Positive coping skills or problem solving skills  Continued Clinical Symptoms:  At this time patient is alert, attentive, improved grooming, good eye contact, speech normal, mood described as improved, denies feeling depressed at this time, affect is mildly anxious, not constricted, no thought disorder, denies suicidal or self injurious ideations at this time, denies any homicidal or violent ideations, no hallucinations, no delusions, not internally preoccupied . Denies current residual or ongoing withdrawal symptoms. Denies medication side effects. Patient requesting discharge- he is future oriented and states his plan is to go to an inpatient rehab program offered by the [facility] system. Of note, declines referrals, states " I am just going to show up there, they always take people in". With patient's express consent and in his presence I spoke with his father- father expressed concern about patient 's long history of opiate dependence, and risk of relapse after discharge, but states he does plan to transport patient to [facility] and assist in his effort to being accepted into their substance abuse program there .  Cognitive Features That Contribute To Risk:  No gross cognitive deficits noted upon discharge. Is alert , attentive, and oriented x 3   Suicide Risk:  Mild:  Suicidal ideation of limited frequency, intensity, duration, and specificity.  There are no identifiable  plans, no associated intent, mild dysphoria and related symptoms, good self-control (both objective and subjective assessment), few other risk factors, and identifiable protective factors, including available and accessible social support.  Follow-up Information    Patient declines referral for outpatient services. .           Plan Of Care/Follow-up recommendations:  Activity:  as tolerated  Diet:  Regular  Tests:  NA Other:  See below  Patient is requesting discharge from unit- there are no current grounds for involuntary commitment ( no SI, no HI, behavior in good control, no psychotic symptoms ) . Patient encouraged to stay longer in order to focus on disposition options, discharge planning , but he declined . As above , patient declined referrals/  planning services but states that he will [facility], seeking admission into their substance abuse treatment inpatient rehab, and father agrees to transport him there. Nehemiah MassedOBOS, FERNANDO, MD 08/19/2016, 12:03 PM

## 2016-10-17 ENCOUNTER — Emergency Department
Admission: EM | Admit: 2016-10-17 | Discharge: 2016-10-17 | Disposition: A | Discharge status: Other Acute Inpatient Hospital | Attending: Emergency Medicine | Admitting: Emergency Medicine

## 2016-10-17 ENCOUNTER — Inpatient Hospital Stay
Admission: AD | Admit: 2016-10-17 | Discharge: 2016-10-21 | DRG: 885 | Disposition: A | Discharge status: Home / Self Care | Source: Ambulatory Visit | Attending: Psychiatry | Admitting: Psychiatry

## 2016-10-17 ENCOUNTER — Encounter: Payer: Self-pay | Admitting: Emergency Medicine

## 2016-10-17 DIAGNOSIS — F1994 Other psychoactive substance use, unspecified with psychoactive substance-induced mood disorder: Secondary | ICD-10-CM | POA: Diagnosis present

## 2016-10-17 DIAGNOSIS — F1910 Other psychoactive substance abuse, uncomplicated: Secondary | ICD-10-CM

## 2016-10-17 DIAGNOSIS — Z046 Encounter for general psychiatric examination, requested by authority: Secondary | ICD-10-CM | POA: Diagnosis present

## 2016-10-17 DIAGNOSIS — F17210 Nicotine dependence, cigarettes, uncomplicated: Secondary | ICD-10-CM | POA: Insufficient documentation

## 2016-10-17 DIAGNOSIS — J45909 Unspecified asthma, uncomplicated: Secondary | ICD-10-CM | POA: Diagnosis not present

## 2016-10-17 DIAGNOSIS — F1123 Opioid dependence with withdrawal: Secondary | ICD-10-CM | POA: Diagnosis present

## 2016-10-17 DIAGNOSIS — F332 Major depressive disorder, recurrent severe without psychotic features: Secondary | ICD-10-CM | POA: Diagnosis present

## 2016-10-17 DIAGNOSIS — F10239 Alcohol dependence with withdrawal, unspecified: Secondary | ICD-10-CM | POA: Diagnosis present

## 2016-10-17 DIAGNOSIS — G4700 Insomnia, unspecified: Secondary | ICD-10-CM | POA: Diagnosis present

## 2016-10-17 DIAGNOSIS — R45851 Suicidal ideations: Secondary | ICD-10-CM

## 2016-10-17 DIAGNOSIS — F17200 Nicotine dependence, unspecified, uncomplicated: Secondary | ICD-10-CM | POA: Diagnosis present

## 2016-10-17 DIAGNOSIS — F1120 Opioid dependence, uncomplicated: Secondary | ICD-10-CM | POA: Diagnosis present

## 2016-10-17 DIAGNOSIS — Z5181 Encounter for therapeutic drug level monitoring: Secondary | ICD-10-CM | POA: Insufficient documentation

## 2016-10-17 DIAGNOSIS — F1020 Alcohol dependence, uncomplicated: Secondary | ICD-10-CM | POA: Diagnosis present

## 2016-10-17 DIAGNOSIS — Z590 Homelessness: Secondary | ICD-10-CM

## 2016-10-17 LAB — CBC
HEMATOCRIT: 46.6 % (ref 40.0–52.0)
Hemoglobin: 16.1 g/dL (ref 13.0–18.0)
MCH: 32 pg (ref 26.0–34.0)
MCHC: 34.6 g/dL (ref 32.0–36.0)
MCV: 92.4 fL (ref 80.0–100.0)
Platelets: 199 10*3/uL (ref 150–440)
RBC: 5.05 MIL/uL (ref 4.40–5.90)
RDW: 13.5 % (ref 11.5–14.5)
WBC: 8.2 10*3/uL (ref 3.8–10.6)

## 2016-10-17 LAB — ETHANOL: Alcohol, Ethyl (B): 28 mg/dL — ABNORMAL HIGH (ref <5)

## 2016-10-17 LAB — URINE DRUG SCREEN, QUALITATIVE (ARMC ONLY)
AMPHETAMINES, UR SCREEN: NOT DETECTED
BENZODIAZEPINE, UR SCRN: NOT DETECTED
Barbiturates, Ur Screen: NOT DETECTED
Cannabinoid 50 Ng, Ur ~~LOC~~: NOT DETECTED
Cocaine Metabolite,Ur ~~LOC~~: NOT DETECTED
MDMA (Ecstasy)Ur Screen: NOT DETECTED
Methadone Scn, Ur: NOT DETECTED
OPIATE, UR SCREEN: NOT DETECTED
PHENCYCLIDINE (PCP) UR S: NOT DETECTED
Tricyclic, Ur Screen: NOT DETECTED

## 2016-10-17 LAB — COMPREHENSIVE METABOLIC PANEL
ALBUMIN: 4.1 g/dL (ref 3.5–5.0)
ALK PHOS: 104 U/L (ref 38–126)
ALT: 42 U/L (ref 17–63)
AST: 24 U/L (ref 15–41)
Anion gap: 10 (ref 5–15)
BILIRUBIN TOTAL: 0.8 mg/dL (ref 0.3–1.2)
BUN: 11 mg/dL (ref 6–20)
CALCIUM: 8.9 mg/dL (ref 8.9–10.3)
CO2: 24 mmol/L (ref 22–32)
CREATININE: 0.84 mg/dL (ref 0.61–1.24)
Chloride: 103 mmol/L (ref 101–111)
GFR calc Af Amer: >60 mL/min (ref >60)
GFR calc non Af Amer: >60 mL/min (ref >60)
GLUCOSE: 188 mg/dL — AB (ref 65–99)
POTASSIUM: 4.1 mmol/L (ref 3.5–5.1)
Sodium: 137 mmol/L (ref 135–145)
TOTAL PROTEIN: 7.2 g/dL (ref 6.5–8.1)

## 2016-10-17 LAB — SALICYLATE LEVEL: Salicylate Lvl: <7 mg/dL (ref 2.8–30.0)

## 2016-10-17 LAB — ACETAMINOPHEN LEVEL: Acetaminophen (Tylenol), Serum: <10 ug/mL — ABNORMAL LOW (ref 10–30)

## 2016-10-17 MED ORDER — ONDANSETRON HCL 4 MG PO TABS: 4.0000 mg | ORAL_TABLET | Freq: Three times a day (TID) | ORAL | Status: DC | PRN

## 2016-10-17 MED ORDER — VITAMIN B-1 100 MG PO TABS
100.0000 mg | ORAL_TABLET | Freq: Every day | ORAL | Status: DC
  Administered 2016-10-17: 100 mg via ORAL
  Filled 2016-10-17: qty 1

## 2016-10-17 MED ORDER — THIAMINE HCL 100 MG/ML IJ SOLN: 100.0000 mg | Freq: Every day | INTRAMUSCULAR | Status: DC

## 2016-10-17 MED ORDER — ADULT MULTIVITAMIN W/MINERALS CH
1.0000 | ORAL_TABLET | Freq: Every day | ORAL | Status: DC
  Administered 2016-10-18 – 2016-10-21 (×2): 1 via ORAL
  Filled 2016-10-17 (×4): qty 1

## 2016-10-17 MED ORDER — LOPERAMIDE HCL 2 MG PO CAPS: 2.0000 mg | ORAL_CAPSULE | ORAL | Status: DC | PRN

## 2016-10-17 MED ORDER — CLINDAMYCIN HCL 150 MG PO CAPS
300.0000 mg | ORAL_CAPSULE | Freq: Three times a day (TID) | ORAL | Status: DC
  Administered 2016-10-17 – 2016-10-20 (×9): 300 mg via ORAL
  Filled 2016-10-17 (×10): qty 2

## 2016-10-17 MED ORDER — HYDROXYZINE HCL 25 MG PO TABS
25.0000 mg | ORAL_TABLET | Freq: Three times a day (TID) | ORAL | Status: DC | PRN
  Administered 2016-10-19 – 2016-10-20 (×6): 25 mg via ORAL
  Filled 2016-10-17 (×7): qty 1

## 2016-10-17 MED ORDER — METHOCARBAMOL 500 MG PO TABS
750.0000 mg | ORAL_TABLET | Freq: Four times a day (QID) | ORAL | Status: DC | PRN
  Administered 2016-10-17: 750 mg via ORAL
  Filled 2016-10-17: qty 1.5
  Filled 2016-10-17: qty 2
  Filled 2016-10-17: qty 1.5

## 2016-10-17 MED ORDER — ACYCLOVIR 200 MG PO CAPS
1000.0000 mg | ORAL_CAPSULE | Freq: Two times a day (BID) | ORAL | Status: DC | PRN
  Administered 2016-10-17: 1000 mg via ORAL
  Filled 2016-10-17 (×2): qty 5

## 2016-10-17 MED ORDER — TRAZODONE HCL 100 MG PO TABS
100.0000 mg | ORAL_TABLET | Freq: Every evening | ORAL | Status: DC | PRN
  Administered 2016-10-17 – 2016-10-20 (×4): 100 mg via ORAL
  Filled 2016-10-17 (×4): qty 1

## 2016-10-17 MED ORDER — LOPERAMIDE HCL 2 MG PO CAPS
2.0000 mg | ORAL_CAPSULE | ORAL | Status: DC | PRN
  Filled 2016-10-17: qty 1

## 2016-10-17 MED ORDER — MAGNESIUM HYDROXIDE 400 MG/5ML PO SUSP: 30.0000 mL | Freq: Every day | ORAL | Status: DC | PRN

## 2016-10-17 MED ORDER — ADULT MULTIVITAMIN W/MINERALS CH
1.0000 | ORAL_TABLET | Freq: Every day | ORAL | Status: DC
  Administered 2016-10-17: 1 via ORAL
  Filled 2016-10-17: qty 1

## 2016-10-17 MED ORDER — ALUM & MAG HYDROXIDE-SIMETH 200-200-20 MG/5ML PO SUSP: 30.0000 mL | ORAL | Status: DC | PRN

## 2016-10-17 MED ORDER — METHOCARBAMOL 750 MG PO TABS
750.0000 mg | ORAL_TABLET | Freq: Four times a day (QID) | ORAL | Status: DC | PRN
  Filled 2016-10-17: qty 1

## 2016-10-17 MED ORDER — ACETAMINOPHEN 325 MG PO TABS: 650.0000 mg | ORAL_TABLET | Freq: Four times a day (QID) | ORAL | Status: DC | PRN

## 2016-10-17 MED ORDER — ACETAMINOPHEN 325 MG PO TABS
650.0000 mg | ORAL_TABLET | Freq: Four times a day (QID) | ORAL | Status: DC | PRN
  Administered 2016-10-19: 650 mg via ORAL
  Filled 2016-10-17: qty 2

## 2016-10-17 MED ORDER — FOLIC ACID 1 MG PO TABS
1.0000 mg | ORAL_TABLET | Freq: Every day | ORAL | Status: DC
  Administered 2016-10-17: 1 mg via ORAL
  Filled 2016-10-17: qty 1

## 2016-10-17 MED ORDER — FOLIC ACID 1 MG PO TABS
1.0000 mg | ORAL_TABLET | Freq: Every day | ORAL | Status: DC
  Administered 2016-10-18 – 2016-10-21 (×2): 1 mg via ORAL
  Filled 2016-10-17 (×4): qty 1

## 2016-10-17 MED ORDER — LORAZEPAM 2 MG/ML IJ SOLN: 1.0000 mg | Freq: Four times a day (QID) | INTRAMUSCULAR | Status: DC | PRN

## 2016-10-17 MED ORDER — VITAMIN B-1 100 MG PO TABS
100.0000 mg | ORAL_TABLET | Freq: Every day | ORAL | Status: DC
  Administered 2016-10-18 – 2016-10-21 (×2): 100 mg via ORAL
  Filled 2016-10-17 (×4): qty 1

## 2016-10-17 MED ORDER — LORAZEPAM 1 MG PO TABS: 1.0000 mg | ORAL_TABLET | Freq: Four times a day (QID) | ORAL | Status: DC | PRN

## 2016-10-17 MED ORDER — LORAZEPAM 1 MG PO TABS
1.0000 mg | ORAL_TABLET | Freq: Four times a day (QID) | ORAL | Status: DC | PRN
  Administered 2016-10-17 – 2016-10-20 (×5): 1 mg via ORAL
  Filled 2016-10-17 (×5): qty 1

## 2016-10-17 NOTE — ED Notes (Signed)
Patient resting quietly in room. No noted distress or abnormal behaviors noted. Will continue 15 minute checks and observation by security camera for safety. CIWA = 0.

## 2016-10-17 NOTE — ED Notes (Signed)
Patient received supper tray. 

## 2016-10-17 NOTE — [facility] Group Notes (Signed)
[facility] Group Notes:  (Nursing/MHT/Case Management/Adjunct)  Date:  10/17/2016  Time:  11:19 PM  Type of Therapy:  Psychoeducational Skills  Participation Level:  Did Not Attend  Participation Quality:  late Admit  Affect:  Scott Brock 10/17/2016, 11:19 PM

## 2016-10-17 NOTE — Progress Notes (Signed)
[facility] by Dr. Toni Amendlapacs.  Attending Physician will be Dr. Jennet MaduroPucilowska.   Patient has been [facility] to room 324, [facility] Charge Nurse [place].   ER staff is aware of the admission ( ER Sect.; ER MD; Patient's Nurse & Patient Access). Morton Simson K. Sherlon HandingHarris, LCAS-A, LPC-A, [facility]  Counselor 10/17/2016 5:50 PM

## 2016-10-17 NOTE — ED Notes (Signed)
Patient resting quietly in room. No noted distress or abnormal behaviors noted. Will continue 15 minute checks and observation by security camera for safety. 

## 2016-10-17 NOTE — ED Provider Notes (Addendum)
[facility] Emergency Department Provider Note  Time seen: 2:48 PM  I have reviewed the triage vital signs and the nursing notes.   HISTORY  Chief Complaint Suicidal    HPI Scott Brock is a 35 y.o. male with a past medical history of substance abuse, depression, alcohol abuse who presents the emergency department for suicidal ideation. According to the patient he is currently going through a divorce and has a lot going on. States he used heroin last night, alcohol today. States he's been having thoughts of hurting himself for the past several days. No specific plan. No medical complaints today.  Past Medical History:  Diagnosis Date  . Acid reflux   . Asthma   . Chronic back pain   . Renal disorder    Kidney stone  . Status post dilation of esophageal narrowing     Patient Active Problem List   Diagnosis Date Noted  . Opioid dependence with opioid-induced mood disorder (HCC)   . Major depressive disorder, recurrent episode, severe, with psychosis (HCC) 08/16/2016  . Substance induced mood disorder (HCC) 07/22/2016  . Alcohol abuse 07/22/2016  . Involuntary commitment 07/22/2016  . Major depressive disorder, single episode, severe without psychotic features (HCC) 03/20/2016  . Opioid use disorder, severe, dependence (HCC) 03/20/2016  . Tobacco use disorder 03/20/2016    Past Surgical History:  Procedure Laterality Date  . APPENDECTOMY    . RHINOPLASTY    . UPPER ENDOSCOPY W/ ESOPHAGEAL MANOMETRY      Prior to Admission medications   Medication Sig Start Date End Date Taking? Authorizing Provider  gabapentin (NEURONTIN) 100 MG capsule Take 2 capsules (200 mg total) by mouth 3 (three) times daily. For agitation 08/19/16   Sanjuana KavaAgnes I Nwoko, NP  hydrOXYzine (ATARAX/VISTARIL) 25 MG tablet Take 1 tablet (25 mg total) by mouth every 6 (six) hours as needed for anxiety. 08/19/16   Sanjuana KavaAgnes I Nwoko, NP  nicotine polacrilex (NICORETTE) 2 MG gum Take 1 each (2 mg  total) by mouth as needed for smoking cessation. 08/19/16   Sanjuana KavaAgnes I Nwoko, NP  traZODone (DESYREL) 150 MG tablet Take 1 tablet (150 mg total) by mouth at bedtime. For insomnia 08/19/16   Sanjuana KavaAgnes I Nwoko, NP  valACYclovir (VALTREX) 1000 MG tablet Take 1 tablet (1,000 mg total) by mouth 2 (two) times daily as needed (outbreak). Herpes outbreak 08/19/16   Sanjuana KavaAgnes I Nwoko, NP    No Known Allergies  No family history on file.  Social History Social History  Substance Use Topics  . Smoking status: Current Every Day Smoker    Packs/day: 0.50    Types: Cigarettes  . Smokeless tobacco: Never Used  . Alcohol use Yes     Comment: social    Review of Systems Constitutional: Negative for fever. Cardiovascular: Negative for chest pain. Respiratory: Negative for shortness of breath. Gastrointestinal: Negative for abdominal pain Neurological: Negative for headache 10-point ROS otherwise negative.  ____________________________________________   PHYSICAL EXAM:  VITAL SIGNS: ED Triage Vitals [10/17/16 1345]  Enc Vitals Group     BP 133/80     Pulse Rate 99     Resp 18     Temp 98.5 F (36.9 C)     Temp Source Oral     SpO2 96 %     Weight 200 lb (90.7 kg)     Height 6\' 1"  (1.854 m)     Head Circumference      Peak Flow  Pain Score      Pain Loc      Pain Edu?      Excl. in GC?     Constitutional: Alert and oriented. Well appearing and in no distress. Eyes: Normal exam ENT   Head: Normocephalic and atraumatic.   Mouth/Throat: Mucous membranes are moist. Cardiovascular: Normal rate, regular rhythm. No murmur Respiratory: Normal respiratory effort without tachypnea nor retractions. Breath sounds are clear Gastrointestinal: Soft and nontender. No distention. Musculoskeletal: Nontender with normal range of motion in all extremities. No lower extremity tenderness or edema. Neurologic:  Normal speech and language. No gross focal neurologic deficits are appreciated. Skin:  Skin is  warm, dry and intact.  Psychiatric: Flat affect  ____________________________________________    INITIAL IMPRESSION / ASSESSMENT AND PLAN / ED COURSE  Pertinent labs & imaging results that were available during my care of the patient were reviewed by me and considered in my medical decision making (see chart for details).  The patient presents the emergency department with alcohol and heroin use admitting depression and suicidal ideation with no specific plan. We'll place the patient under an involuntary commitment to the patient to be adequately evaluated by psychiatry. Patient's medical workup shows an alcohol level of 28, urine drug screen is negative. Patient states he used heroin last night.  Patient has been seen by psychiatry. They will be admitted for further treatment.  ____________________________________________   FINAL CLINICAL IMPRESSION(S) / ED DIAGNOSES  Substance abuse Suicidal ideation    Minna AntisKevin Annjanette Wertenberger, MD 10/17/16 1450    Minna AntisKevin Shynice Sigel, MD 10/17/16 [phone]

## 2016-10-17 NOTE — ED Notes (Signed)

## 2016-10-17 NOTE — ED Notes (Signed)

## 2016-10-17 NOTE — ED Notes (Signed)
[facility]. Patient made aware of transfer and is cooperative.

## 2016-10-17 NOTE — ED Notes (Signed)
Sandwich and soft drink given.  

## 2016-10-17 NOTE — ED Notes (Signed)
Patient cooperative with all nursing interventions. He is endorsing depressed mood, no clear precipitant. + SI with thoughts of overdosing or shooting himself. No evidence of psychosis. Patient has been using ETOH and heroin on a regular basis. Patient met with psychiatrist.Maintained on 15 minute checks and observation by security camera for safety.

## 2016-10-17 NOTE — ED Notes (Signed)

## 2016-10-17 NOTE — ED Triage Notes (Signed)
PT PRESENTS VOLUNTARY WITH SUICIDAL THOUGHTS FOR THREE DAYS. PT STATES HAS A LOT GOING ON IN HIS LIFE RIGHT NOW AND HAS A PLAN TO EITHER SHOOT HIMSELF OR OVERDOSE. PT NOT MAKING EYE CONTACT IN TRIAGE.

## 2016-10-17 NOTE — Consult Note (Signed)
[facility] Consult   Reason for Consult:  Consult for 35 year old man with a history of substance abuse and depressive symptoms comes into the hospital claiming he is actively suicidal. Referring Physician:  Kerman Passey Patient Identification: Scott Brock MRN:  [id number] Principal Diagnosis: Substance induced mood disorder ([place]) Diagnosis:   Patient Active Problem List   Diagnosis Date Noted  . Suicidal ideation [R45.851] 10/17/2016  . Opioid dependence with opioid-induced mood disorder ([place]) [F11.24]   . Major depressive disorder, recurrent episode, severe, with psychosis ([place]) [F33.3] 08/16/2016  . Substance induced mood disorder ([place]) [F19.94] 07/22/2016  . Alcohol abuse [F10.10] 07/22/2016  . Involuntary commitment [Z04.6] 07/22/2016  . Major depressive disorder, single episode, severe without psychotic features ([place]) [F32.2] 03/20/2016  . Opioid use disorder, severe, dependence ([place]) [F11.20] 03/20/2016  . Tobacco use disorder [F17.200] 03/20/2016    Total Time spent with patient: 1 hour  Subjective:   Scott Brock is a 35 y.o. male patient admitted with "I'm suicidal".  HPI:  Patient interviewed. Chart reviewed. Labs and vitals reviewed. 35 year old man presents to the emergency room saying his mood is been very sad and down for several days. He says he is having active suicidal thoughts with thoughts of shooting himself or hanging himself. He says the only thing that stopped him was the thought that maybe he could come into the hospital. Sleeping poorly. Appetite poor. Feels like he has little or no support. Not going for any kind of outpatient treatment. I asked him if he had stayed on his medicine after his last discharge and he said he hadn't but it looks like he really wasn't on anything specific. He says he's been continuing to drink most recently having alcohol this morning and consuming about a pint a day. He says he last had opiates the day before yesterday  and has been using heroin intermittently again. Denies other drug abuse. Denies psychotic symptoms.  Social history: Says that he is living on his own. Feels like he doesn't have any support. Not currently working.  Medical history: Patient has had opiate withdrawal symptoms and some physical injuries related to substance abuse but otherwise no active medical problems.  Substance abuse history: Long-standing opiate abuse problem including heroin. Has had hospitalizations a couple times before most recently just within the last month or so at behavioral health. Unclear if he was ever on any antidepressive medicine they did not treat him with any at behavioral health even though he had been given a diagnosis of major depression  Past Psychiatric History: Patient has been given a diagnosis of major depression in the past although he was not on antidepressants and doesn't seem to have any familiarity with appropriate treatment for depression. Patient has threatened suicide in the past but it doesn't sound like he's never actually tried to kill himself previously. Diagnosis also of substance-induced mood disorder.  Risk to Self: Is patient at risk for suicide?: Yes Risk to Others:   Prior Inpatient Therapy:   Prior Outpatient Therapy:    Past Medical History:  Past Medical History:  Diagnosis Date  . Acid reflux   . Asthma   . Chronic back pain   . Renal disorder    Kidney stone  . Status post dilation of esophageal narrowing     Past Surgical History:  Procedure Laterality Date  . APPENDECTOMY    . RHINOPLASTY    . UPPER ENDOSCOPY W/ ESOPHAGEAL MANOMETRY     Family History: No family history  on file. Family Psychiatric  History: Patient says that his mother has had depression with suicidal thinking as well. Social History:  History  Alcohol Use  . Yes    Comment: social     History  Drug Use  . Types: IV, Heroin    Comment: heroin    Social History   Social History  .  Marital status: Single    Spouse name: N/A  . Number of children: N/A  . Years of education: N/A   Social History Main Topics  . Smoking status: Current Every Day Smoker    Packs/day: 0.50    Types: Cigarettes  . Smokeless tobacco: Never Used  . Alcohol use Yes     Comment: social  . Drug use:     Types: IV, Heroin     Comment: heroin  . Sexual activity: Yes    Birth control/ protection: None   Other Topics Concern  . None   Social History Narrative  . None   Additional Social History:    Allergies:  No Known Allergies  Labs:  Results for orders placed or performed during the hospital encounter of 10/17/16 (from the past 48 hour(s))  Comprehensive metabolic panel     Status: Abnormal   Collection Time: 10/17/16  1:45 PM  Result Value Ref Range   Sodium 137 135 - 145 mmol/L   Potassium 4.1 3.5 - 5.1 mmol/L   Chloride 103 101 - 111 mmol/L   CO2 24 22 - 32 mmol/L   Glucose, Bld 188 (H) 65 - 99 mg/dL   BUN 11 6 - 20 mg/dL   Creatinine, Ser 0.02 0.61 - 1.24 mg/dL   Calcium 8.9 8.9 - 98.0 mg/dL   Total Protein 7.2 6.5 - 8.1 g/dL   Albumin 4.1 3.5 - 5.0 g/dL   AST 24 15 - 41 U/L   ALT 42 17 - 63 U/L   Alkaline Phosphatase 104 38 - 126 U/L   Total Bilirubin 0.8 0.3 - 1.2 mg/dL   GFR calc non Af Amer >60 >60 mL/min   GFR calc Af Amer >60 >60 mL/min    Comment: (NOTE) The eGFR has been calculated using the CKD EPI equation. This calculation has not been validated in all clinical situations. eGFR's persistently <60 mL/min signify possible Chronic Kidney Disease.    Anion gap 10 5 - 15  Ethanol     Status: Abnormal   Collection Time: 10/17/16  1:45 PM  Result Value Ref Range   Alcohol, Ethyl (B) 28 (H) <5 mg/dL    Comment:        LOWEST DETECTABLE LIMIT FOR SERUM ALCOHOL IS 5 mg/dL FOR MEDICAL PURPOSES ONLY   Salicylate level     Status: None   Collection Time: 10/17/16  1:45 PM  Result Value Ref Range   Salicylate Lvl <7.0 2.8 - 30.0 mg/dL  Acetaminophen  level     Status: Abnormal   Collection Time: 10/17/16  1:45 PM  Result Value Ref Range   Acetaminophen (Tylenol), Serum <10 (L) 10 - 30 ug/mL    Comment:        THERAPEUTIC CONCENTRATIONS VARY SIGNIFICANTLY. A RANGE OF 10-30 ug/mL MAY BE AN EFFECTIVE CONCENTRATION FOR MANY PATIENTS. HOWEVER, SOME ARE BEST TREATED AT CONCENTRATIONS OUTSIDE THIS RANGE. ACETAMINOPHEN CONCENTRATIONS >150 ug/mL AT 4 HOURS AFTER INGESTION AND >50 ug/mL AT 12 HOURS AFTER INGESTION ARE OFTEN ASSOCIATED WITH TOXIC REACTIONS.   cbc     Status: None   Collection  Time: 10/17/16  1:45 PM  Result Value Ref Range   WBC 8.2 3.8 - 10.6 K/uL   RBC 5.05 4.40 - 5.90 MIL/uL   Hemoglobin 16.1 13.0 - 18.0 g/dL   HCT 46.6 40.0 - 52.0 %   MCV 92.4 80.0 - 100.0 fL   MCH 32.0 26.0 - 34.0 pg   MCHC 34.6 32.0 - 36.0 g/dL   RDW 13.5 11.5 - 14.5 %   Platelets 199 150 - 440 K/uL  Urine Drug Screen, Qualitative     Status: None   Collection Time: 10/17/16  1:45 PM  Result Value Ref Range   Tricyclic, Ur Screen NONE DETECTED NONE DETECTED   Amphetamines, Ur Screen NONE DETECTED NONE DETECTED   MDMA (Ecstasy)Ur Screen NONE DETECTED NONE DETECTED   Cocaine Metabolite,Ur [place] NONE DETECTED NONE DETECTED   Opiate, Ur Screen NONE DETECTED NONE DETECTED   Phencyclidine (PCP) Ur S NONE DETECTED NONE DETECTED   Cannabinoid 50 Ng, Ur [place] NONE DETECTED NONE DETECTED   Barbiturates, Ur Screen NONE DETECTED NONE DETECTED   Benzodiazepine, Ur Scrn NONE DETECTED NONE DETECTED   Methadone Scn, Ur NONE DETECTED NONE DETECTED    Comment: (NOTE) 956  Tricyclics, urine               Cutoff 1000 ng/mL 200  Amphetamines, urine             Cutoff 1000 ng/mL 300  MDMA (Ecstasy), urine           Cutoff 500 ng/mL 400  Cocaine Metabolite, urine       Cutoff 300 ng/mL 500  Opiate, urine                   Cutoff 300 ng/mL 600  Phencyclidine (PCP), urine      Cutoff 25 ng/mL 700  Cannabinoid, urine              Cutoff 50 ng/mL 800  Barbiturates,  urine             Cutoff 200 ng/mL 900  Benzodiazepine, urine           Cutoff 200 ng/mL 1000 Methadone, urine                Cutoff 300 ng/mL 1100 1200 The urine drug screen provides only a preliminary, unconfirmed 1300 analytical test result and should not be used for non-medical 1400 purposes. Clinical consideration and professional judgment should 1500 be applied to any positive drug screen result due to possible 1600 interfering substances. A more specific alternate chemical method 1700 must be used in order to obtain a confirmed analytical result.  1800 Gas chromato graphy / mass spectrometry (GC/MS) is the preferred 1900 confirmatory method.     No current facility-administered medications for this encounter.    Current Outpatient Prescriptions  Medication Sig Dispense Refill  . gabapentin (NEURONTIN) 100 MG capsule Take 2 capsules (200 mg total) by mouth 3 (three) times daily. For agitation 180 capsule 0  . hydrOXYzine (ATARAX/VISTARIL) 25 MG tablet Take 1 tablet (25 mg total) by mouth every 6 (six) hours as needed for anxiety. 60 tablet 0  . nicotine polacrilex (NICORETTE) 2 MG gum Take 1 each (2 mg total) by mouth as needed for smoking cessation. 100 tablet 0  . traZODone (DESYREL) 150 MG tablet Take 1 tablet (150 mg total) by mouth at bedtime. For insomnia 30 tablet 0  . valACYclovir (VALTREX) 1000 MG tablet Take 1 tablet (1,000 mg  total) by mouth 2 (two) times daily as needed (outbreak). Herpes outbreak      Musculoskeletal: Strength & Muscle Tone: within normal limits Gait & Station: normal Patient leans: N/A  Psychiatric Specialty Exam: Physical Exam  Nursing note and vitals reviewed. Constitutional: He appears well-developed and well-nourished.  HENT:  Head: Normocephalic and atraumatic.  Eyes: Conjunctivae are normal. Pupils are equal, round, and reactive to light.  Neck: Normal range of motion.  Cardiovascular: Regular rhythm and normal heart sounds.     Respiratory: Effort normal. No respiratory distress.  GI: Soft.  Musculoskeletal: Normal range of motion.  Neurological: He is alert.  Skin: Skin is warm and dry.     Psychiatric: His affect is blunt. His speech is delayed. He is slowed. Cognition and memory are normal. He expresses impulsivity. He exhibits a depressed mood. He expresses suicidal ideation. He expresses suicidal plans.    Review of Systems  Constitutional: Negative.   HENT: Negative.   Eyes: Negative.   Respiratory: Negative.   Cardiovascular: Negative.   Gastrointestinal: Negative.   Musculoskeletal: Negative.   Skin: Negative.   Neurological: Negative.   Psychiatric/Behavioral: Positive for depression, memory loss, substance abuse and suicidal ideas. Negative for hallucinations. The patient is nervous/anxious and has insomnia.     Blood pressure 133/80, pulse 99, temperature 98.5 F (36.9 C), temperature source Oral, resp. rate 18, height '6\' 1"'$  (1.854 m), weight 90.7 kg (200 lb), SpO2 96 %.Body mass index is 26.39 kg/m.  General Appearance: Disheveled  Eye Contact:  Minimal  Speech:  Slow  Volume:  Decreased  Mood:  Depressed  Affect:  Congruent  Thought Process:  Goal Directed  Orientation:  Full (Time, Place, and Person)  Thought Content:  Logical  Suicidal Thoughts:  Yes.  with intent/plan  Homicidal Thoughts:  No  Memory:  Immediate;   Good Recent;   Poor Remote;   Fair  Judgement:  Impaired  Insight:  Shallow  Psychomotor Activity:  Decreased  Concentration:  Concentration: Poor  Recall:  [employer] of Knowledge:  Fair  Language:  Fair  Akathisia:  No  Handed:  Right  AIMS (if indicated):     Assets:  Communication Skills Desire for Improvement  ADL's:  Impaired  Cognition:  Impaired,  Mild  Sleep:        Treatment Plan Summary: Daily contact with patient to assess and evaluate symptoms and progress in treatment, Medication management and Plan 35 year old man who presents with  extremely intense suicidal thoughts. Depressed. Claims she's been abusing opiates. Drug screen is negative for opiates. Does have some alcohol in it from this morning. Patient will be admitted to the psychiatry ward. 15 minute checks. CIWA protocol in effect. When necessary medicines if needed for other symptoms related opiate withdrawal. Defer starting any antidepressant medicine until evaluated by primary treatment team. Case reviewed with TTS and emergency room physician.  Disposition: Recommend psychiatric Inpatient admission when medically cleared. Supportive therapy provided about ongoing stressors.  Alethia Berthold, MD 10/17/2016 3:50 PM

## 2016-10-17 NOTE — ED Notes (Signed)
Pt. Alert and oriented, warm and dry, in no distress. Pt. Denies SI, HI, and AVH. Patient has superficial scratches to left forearm and upper arm. Areas are scabbed over with no drainage and in the healing stage.  Pt. Encouraged to let nursing staff know of any concerns or needs.

## 2016-10-18 ENCOUNTER — Encounter: Payer: Self-pay | Admitting: Psychiatry

## 2016-10-18 DIAGNOSIS — F332 Major depressive disorder, recurrent severe without psychotic features: Principal | ICD-10-CM

## 2016-10-18 MED ORDER — ACYCLOVIR 200 MG PO CAPS
1000.0000 mg | ORAL_CAPSULE | Freq: Two times a day (BID) | ORAL | Status: DC
  Administered 2016-10-18 – 2016-10-20 (×4): 1000 mg via ORAL
  Filled 2016-10-18 (×4): qty 5

## 2016-10-18 NOTE — Progress Notes (Signed)
Patient has remained in room in bed for majority of this shift. Did not get up for breakfast or lunch.  Affect is sad.  Denies SI at this time.  Concerned with receiving his valtrex. Informed Dr. Jennet MaduroPucilowska and orders received.  Support and encouragement offered.  Safety maintained and q 15 minute rounds done.

## 2016-10-18 NOTE — Progress Notes (Signed)
Recreation Therapy Notes  Date: 11.06.17 Time: 9:30 am Location: Craft Room  Group Topic: Self-expression  Goal Area(s) Addresses:  Patient will identify one color per emotion listed on wheel.  Patient will verbalize benefit of using art as a means of self-expression. Patient will verbalize one emotion experienced during session. Patient will be educated on other forms of self-expression.  Behavioral Response: Did not attend  Intervention: Emotion Wheel  Activity: Patients were given a worksheet with 7 emotions listed and were instructed to pick a color for each emotion.  Education: LRT educated patients on other forms of self-expression.  Education Outcome: Patient did not attend group.  Clinical Observations/Feedback: Patient did not attend group.  Jacquelynn CreeGreene,Lamond Glantz M, LRT/CTRS 10/18/2016 10:20 AM

## 2016-10-18 NOTE — Plan of Care (Signed)
Problem: Activity: Goal: Interest or engagement in leisure activities will improve Outcome: Progressing Isolates to room.  No interaction noted with peers

## 2016-10-18 NOTE — Tx Team (Signed)
Initial Treatment Plan 10/18/2016 3:11 AM Roseanna RainbowMark J Garde [id number]    PATIENT STRESSORS: Financial difficulties Health problems Substance abuse   PATIENT STRENGTHS: Ability for insight Communication skills Motivation for treatment/growth   PATIENT IDENTIFIED PROBLEMS: Depression      Suicide ideation    Substance abuse.                DISCHARGE CRITERIA:  Medical problems require only outpatient monitoring Motivation to continue treatment in a less acute level of care Withdrawal symptoms are absent or subacute and managed without 24-hour nursing intervention  PRELIMINARY DISCHARGE PLAN: Attend 12-step recovery group  PATIENT/FAMILY INVOLVEMENT: This treatment plan has been presented to and reviewed with the patient, Toribio HarbourMark J Eide, The patient and family have been given the opportunity to ask questions and make suggestions.  Trula Orelubukola Abisola Brynnley Dayrit, RN 10/18/2016, 3:11 AM

## 2016-10-18 NOTE — [facility] Suicide Risk Assessment (Signed)
[facility]   Nursing information obtained from:    Demographic factors:    Current Mental Status:    Loss Factors:    Historical Factors:    Risk Reduction Factors:     Total Time spent with patient: 1 hour Principal Problem: <principal problem not specified> Diagnosis:   Patient Active Problem List   Diagnosis Date Noted  . Suicidal ideation [R45.851] 10/17/2016  . Severe episode of recurrent major depressive disorder, without psychotic features ([facility]) [F33.2] 08/16/2016  . Substance induced mood disorder ([facility]) [F19.94] 07/22/2016  . Alcohol use disorder, moderate, dependence ([facility]) [F10.20] 07/22/2016  . Involuntary commitment [Z04.6] 07/22/2016  . Opioid use disorder, severe, dependence ([facility]) [F11.20] 03/20/2016  . Tobacco use disorder [F17.200] 03/20/2016   Subjective Data: suicidal ideation.  Continued Clinical Symptoms:  Alcohol Use Disorder Identification Test Final Score (AUDIT): 28 The "Alcohol Use Disorders Identification Test", Guidelines for Use in Primary Care, Second Edition.  World Science writerHealth Organization [facility](WHO). Score between 0-7:  no or low risk or alcohol related problems. Score between 8-15:  moderate risk of alcohol related problems. Score between 16-19:  high risk of alcohol related problems. Score 20 or above:  warrants further diagnostic evaluation for alcohol dependence and treatment.   CLINICAL FACTORS:   Depression:   Comorbid alcohol abuse/dependence Impulsivity Alcohol/Substance Abuse/Dependencies   Musculoskeletal: Strength & Muscle Tone: within normal limits Gait & Station: normal Patient leans: N/A  Psychiatric Specialty Exam: Physical Exam  Nursing note and vitals reviewed.   Review of Systems  Constitutional: Positive for chills and malaise/fatigue.  Neurological: Positive for weakness.  Psychiatric/Behavioral: Positive for depression, substance abuse and suicidal ideas.  All other systems reviewed and are  negative.   Blood pressure (!) 98/49, pulse 77, temperature 97.5 F (36.4 C), temperature source Oral, resp. rate 18, height 6\' 1"  (1.854 m), weight 86.6 kg (191 lb), SpO2 97 %.Body mass index is 25.2 kg/m.  General Appearance: Disheveled  Eye Contact:  Minimal  Speech:  Slow  Volume:  Decreased  Mood:  Depressed, Hopeless, Irritable and Worthless  Affect:  Congruent  Thought Process:  Goal Directed and Descriptions of Associations: Intact  Orientation:  Full (Time, Place, and Person)  Thought Content:  WDL  Suicidal Thoughts:  Yes.  with intent/plan  Homicidal Thoughts:  No  Memory:  Immediate;   Fair Recent;   Fair Remote;   Fair  Judgement:  Poor  Insight:  Lacking  Psychomotor Activity:  Psychomotor Retardation  Concentration:  Concentration: Fair and Attention Span: Fair  Recall:  [employer]  Fund of Knowledge:  Fair  Language:  Fair  Akathisia:  No  Handed:  Right  AIMS (if indicated):     Assets:  Communication Skills Desire for Improvement Physical Health Resilience  ADL's:  Intact  Cognition:  WNL  Sleep:  Number of Hours: 5      COGNITIVE FEATURES THAT CONTRIBUTE TO RISK:  None    SUICIDE RISK:   Moderate:  Frequent suicidal ideation with limited intensity, and duration, some specificity in terms of plans, no associated intent, good self-control, limited dysphoria/symptomatology, some risk factors present, and identifiable protective factors, including available and accessible social support.   PLAN OF CARE: Hospital admission, medication management, substance abuse counseling, discharge planning.  Mr. [name] is a 35 year old male with history of opiate dependence and depression admitted for suicidal ideation in the context of substance use.  1. Suicidal ideation. The patient is able to contract for safety in the  hospital.  2. Mood. The patient is not interested in pharmacotherapy.   3. Opiate detox. We started symptomatic treatment for opioid withdrawal.    4. Smoking. Nicotine patch is available.   5. Insomnia. Trazodone is available.   6. Substance abuse treatment. The patient is unable to discuss treatment options.   7. Alcohol detox. Ativan is available for CIWA >8.   8. Disposition. TBE.   I certify that inpatient services furnished can reasonably be expected to improve the patient's condition.  Kristine LineaJolanta Trea Carnegie, MD 10/18/2016, 12:59 PM
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
## 2016-10-18 NOTE — BHH Counselor (Signed)
CSW attempted PSA, but pt presented as too acute, as evidenced by the pt reporting fatigue and exhibiting signs of depression.  CSW and pt agreed that the CSW would attempt PSA on following day.  Dorothe PeaJonathan F. Felice Hope, LCSWA, LCAS

## 2016-10-18 NOTE — Plan of Care (Signed)
Problem: Coping: Goal: Ability to verbalize frustrations and anger appropriately will improve Outcome: Progressing Patient verbalized frustration to staff.    

## 2016-10-18 NOTE — Progress Notes (Signed)
Admission Note:  [age] yr male who presents IVC in no acute distress for the treatment of SI, Substance Abuse and Depression. Patient's affect is flat and depressed, his mood is irritable and restless. Pt was cooperative with admission process. Pt presents with passive SI and contracts for safety upon admission. Pt denies AVH. Pt stated he experienced SI because of worsening depression, heavy substance use, restlessness and hopelessness. Pt has Past medical Hx of Asthma, Depression, Opoid dependence, and  Alcohol dependence  Patient's skin was assessed and found to be dry, with multiple scratch marks noted on upper torso. Patient stated "scratch marks was result [facility]"  Patient  was searched and no contraband found, POC and unit policies explained and understanding verbalized. Consents obtained. Food and fluids offered, and fluids accepted. Pt had no additional questions or concerns, will continue to monitor.

## 2016-10-19 NOTE — BHH Group Notes (Signed)
BHH Group Notes:  (Nursing/MHT/Case Management/Adjunct)  Date:  10/19/2016  Time:  6:05 PM  Type of Therapy:  Psychoeducational Skills  Participation Level:  Did Not Attend  Lynelle SmokeCara Travis [facility]Dwanna Goshert 10/19/2016, 6:05 PM

## 2016-10-19 NOTE — Progress Notes (Signed)
Recreation Therapy Notes  Date: 11.07.17 Time: 9:30 am Location: Craft Room  Group Topic: Goal Setting  Goal Area(s) Addresses:  Patient will write one goal. Patient will verbalize benefit of setting goals.  Behavioral Response: Did not attend  Intervention: Step By Step  Activity: Patients were given a worksheet with a foot on it and were instructed to write their goal inside the foot. On the outside of the foot, patients were instructed to write positive statements to help them to keep working on their goals.  Education: LRT educated patients on ways to celebrate once they have reached their goals.  Education Outcome: Patient did not attend group.  Clinical Observations/Feedback: Patient did not attend group.  Jacquelynn CreeGreene,Tre Sanker M, LRT/CTRS 10/19/2016 10:14 AM

## 2016-10-19 NOTE — Progress Notes (Signed)
Pt awake, alert, oriented today. Stayed in room this morning with minimal interaction unless approached. Observed out of room more this afternoon with some interaction with staff/peers. Stated this morning "I just want to lay here..." No eye contact. Eye contact improved this afternoon. States his discharge plan is to [facility] a 9 month program he started in the past, but left. Denies SI/HI/AVH. Fluids provided and encouraged today. Medications administered as ordered. Continues to appear anxious, depressed, down.  Support and encouragement provided with use of therapeutic communication. Medications administered as ordered with education. Safety maintained with every 15 minute checks. Will continue to monitor.

## 2016-10-19 NOTE — Progress Notes (Signed)
D: Patient has remained in room except for snack. Patient states he feels anxious. Denies SI/HI/AVH. Exhibiting tremors. VS stable. States he drinks about a pint a day.  A: Medication given with education. Encouragement provided.  R: Patient was compliant with medication. He has remained calm and cooperative. Safety maintained with 15 min checks.

## 2016-10-19 NOTE — Progress Notes (Signed)
[facility] MD Progress Note  10/19/2016 12:44 PM Scott Brock  MRN:  [id number]  Subjective:  Scott Brock met with treatment team today. He has multiple physical complaints related to withdrawal symptoms from alcohol and opioids. She is discharged with poor hygiene. He maintains limited eye contact. He is able to answer simple questions. The patient apparently would like to return to [facility] substance abuse treatment program that he left prematurely. He endorses symptoms of depression and passing suicidal thoughts but was able to discuss discharge planning. Psychotropic medications are not allowed in this program. He has not been participating in programming as of yet.  Principal Problem: Severe episode of recurrent major depressive disorder, without psychotic features (HCC)  Diagnosis:   Patient Active Problem List   Diagnosis Date Noted  . Suicidal ideation [R45.851] 10/17/2016  . Severe episode of recurrent major depressive disorder, without psychotic features (HCC) [F33.2] 08/16/2016  . Substance induced mood disorder (HCC) [F19.94] 07/22/2016  . Alcohol use disorder, moderate, dependence (HCC) [F10.20] 07/22/2016  . Involuntary commitment [Z04.6] 07/22/2016  . Opioid use disorder, severe, dependence (HCC) [F11.20] 03/20/2016  . Tobacco use disorder [F17.200] 03/20/2016   Total Time spent with patient: 20 minutes  Past Psychiatric History: substance abuse.  Past Medical History:  Past Medical History:  Diagnosis Date  . Acid reflux   . Asthma   . Chronic back pain   . Renal disorder    Kidney stone  . Status post dilation of esophageal narrowing     Past Surgical History:  Procedure Laterality Date  . APPENDECTOMY    . RHINOPLASTY    . UPPER ENDOSCOPY W/ ESOPHAGEAL MANOMETRY     Family History: History reviewed. No pertinent family history. Family Psychiatric  History: See H&P. Social History:  History  Alcohol Use  . Yes    Comment: social     History  Drug Use  .  Types: IV, Heroin    Comment: heroin    Social History   Social History  . Marital status: Single    Spouse name: N/A  . Number of children: N/A  . Years of education: N/A   Social History Main Topics  . Smoking status: Current Every Day Smoker    Packs/day: 0.50    Years: 15.00    Types: Cigarettes  . Smokeless tobacco: Never Used  . Alcohol use Yes     Comment: social  . Drug use:     Types: IV, Heroin     Comment: heroin  . Sexual activity: Yes    Birth control/ protection: None   Other Topics Concern  . None   Social History Narrative  . None   Additional Social History:                         Sleep: Fair  Appetite:  Fair  Current Medications: Current Facility-Administered Medications  Medication Dose Route Frequency Provider Last Rate Last Dose  . acetaminophen (TYLENOL) tablet 650 mg  650 mg Oral Q6H PRN Audery Amel, MD      . acyclovir (ZOVIRAX) 200 MG capsule 1,000 mg  1,000 mg Oral BID Shari Prows, MD   1,000 mg at 10/19/16 0853  . alum & mag hydroxide-simeth (MAALOX/MYLANTA) 200-200-20 MG/5ML suspension 30 mL  30 mL Oral Q4H PRN Audery Amel, MD      . clindamycin (CLEOCIN) capsule 300 mg  300 mg Oral Q8H Audery Amel, MD   300  mg at 10/19/16 0635  . folic acid (FOLVITE) tablet 1 mg  1 mg Oral Daily Gonzella Lex, MD   1 mg at 10/18/16 1413  . hydrOXYzine (ATARAX/VISTARIL) tablet 25 mg  25 mg Oral TID PRN Gonzella Lex, MD   25 mg at 10/19/16 0855  . loperamide (IMODIUM) capsule 2 mg  2 mg Oral PRN Gonzella Lex, MD      . LORazepam (ATIVAN) tablet 1 mg  1 mg Oral Q6H PRN Gonzella Lex, MD   1 mg at 10/19/16 1139  . magnesium hydroxide (MILK OF MAGNESIA) suspension 30 mL  30 mL Oral Daily PRN Gonzella Lex, MD      . methocarbamol (ROBAXIN) tablet 750 mg  750 mg Oral Q6H PRN Gonzella Lex, MD   750 mg at 10/17/16 2351  . multivitamin with minerals tablet 1 tablet  1 tablet Oral Daily Gonzella Lex, MD   1 tablet at  10/18/16 1414  . ondansetron (ZOFRAN) tablet 4 mg  4 mg Oral Q8H PRN Gonzella Lex, MD      . thiamine (VITAMIN B-1) tablet 100 mg  100 mg Oral Daily Gonzella Lex, MD   100 mg at 10/18/16 1413   Or  . thiamine (B-1) injection 100 mg  100 mg Intravenous Daily Gonzella Lex, MD      . traZODone (DESYREL) tablet 100 mg  100 mg Oral QHS PRN Gonzella Lex, MD   100 mg at 10/18/16 2141    Lab Results:  Results for orders placed or performed during the hospital encounter of 10/17/16 (from the past 48 hour(s))  Comprehensive metabolic panel     Status: Abnormal   Collection Time: 10/17/16  1:45 PM  Result Value Ref Range   Sodium 137 135 - 145 mmol/L   Potassium 4.1 3.5 - 5.1 mmol/L   Chloride 103 101 - 111 mmol/L   CO2 24 22 - 32 mmol/L   Glucose, Bld 188 (H) 65 - 99 mg/dL   BUN 11 6 - 20 mg/dL   Creatinine, Ser 0.84 0.61 - 1.24 mg/dL   Calcium 8.9 8.9 - 10.3 mg/dL   Total Protein 7.2 6.5 - 8.1 g/dL   Albumin 4.1 3.5 - 5.0 g/dL   AST 24 15 - 41 U/L   ALT 42 17 - 63 U/L   Alkaline Phosphatase 104 38 - 126 U/L   Total Bilirubin 0.8 0.3 - 1.2 mg/dL   GFR calc non Af Amer >60 >60 mL/min   GFR calc Af Amer >60 >60 mL/min    Comment: (NOTE) The eGFR has been calculated using the CKD EPI equation. This calculation has not been validated in all clinical situations. eGFR's persistently <60 mL/min signify possible Chronic Kidney Disease.    Anion gap 10 5 - 15  Ethanol     Status: Abnormal   Collection Time: 10/17/16  1:45 PM  Result Value Ref Range   Alcohol, Ethyl (B) 28 (H) <5 mg/dL    Comment:        LOWEST DETECTABLE LIMIT FOR SERUM ALCOHOL IS 5 mg/dL FOR MEDICAL PURPOSES ONLY   Salicylate level     Status: None   Collection Time: 10/17/16  1:45 PM  Result Value Ref Range   Salicylate Lvl <3.8 2.8 - 30.0 mg/dL  Acetaminophen level     Status: Abnormal   Collection Time: 10/17/16  1:45 PM  Result Value Ref Range   Acetaminophen (Tylenol), Serum <  10 (L) 10 - 30 ug/mL     Comment:        THERAPEUTIC CONCENTRATIONS VARY SIGNIFICANTLY. A RANGE OF 10-30 ug/mL MAY BE AN EFFECTIVE CONCENTRATION FOR MANY PATIENTS. HOWEVER, SOME ARE BEST TREATED AT CONCENTRATIONS OUTSIDE THIS RANGE. ACETAMINOPHEN CONCENTRATIONS >150 ug/mL AT 4 HOURS AFTER INGESTION AND >50 ug/mL AT 12 HOURS AFTER INGESTION ARE OFTEN ASSOCIATED WITH TOXIC REACTIONS.   cbc     Status: None   Collection Time: 10/17/16  1:45 PM  Result Value Ref Range   WBC 8.2 3.8 - 10.6 K/uL   RBC 5.05 4.40 - 5.90 MIL/uL   Hemoglobin 16.1 13.0 - 18.0 g/dL   HCT 97.6 73.4 - 19.3 %   MCV 92.4 80.0 - 100.0 fL   MCH 32.0 26.0 - 34.0 pg   MCHC 34.6 32.0 - 36.0 g/dL   RDW 79.0 24.0 - 97.3 %   Platelets 199 150 - 440 K/uL  Urine Drug Screen, Qualitative     Status: None   Collection Time: 10/17/16  1:45 PM  Result Value Ref Range   Tricyclic, Ur Screen NONE DETECTED NONE DETECTED   Amphetamines, Ur Screen NONE DETECTED NONE DETECTED   MDMA (Ecstasy)Ur Screen NONE DETECTED NONE DETECTED   Cocaine Metabolite,Ur [place] NONE DETECTED NONE DETECTED   Opiate, Ur Screen NONE DETECTED NONE DETECTED   Phencyclidine (PCP) Ur S NONE DETECTED NONE DETECTED   Cannabinoid 50 Ng, Ur  NONE DETECTED NONE DETECTED   Barbiturates, Ur Screen NONE DETECTED NONE DETECTED   Benzodiazepine, Ur Scrn NONE DETECTED NONE DETECTED   Methadone Scn, Ur NONE DETECTED NONE DETECTED    Comment: (NOTE) 100  Tricyclics, urine               Cutoff 1000 ng/mL 200  Amphetamines, urine             Cutoff 1000 ng/mL 300  MDMA (Ecstasy), urine           Cutoff 500 ng/mL 400  Cocaine Metabolite, urine       Cutoff 300 ng/mL 500  Opiate, urine                   Cutoff 300 ng/mL 600  Phencyclidine (PCP), urine      Cutoff 25 ng/mL 700  Cannabinoid, urine              Cutoff 50 ng/mL 800  Barbiturates, urine             Cutoff 200 ng/mL 900  Benzodiazepine, urine           Cutoff 200 ng/mL 1000 Methadone, urine                Cutoff 300  ng/mL 1100 1200 The urine drug screen provides only a preliminary, unconfirmed 1300 analytical test result and should not be used for non-medical 1400 purposes. Clinical consideration and professional judgment should 1500 be applied to any positive drug screen result due to possible 1600 interfering substances. A more specific alternate chemical method 1700 must be used in order to obtain a confirmed analytical result.  1800 Gas chromato graphy / mass spectrometry (GC/MS) is the preferred 1900 confirmatory method.     Blood Alcohol level:  Lab Results  Component Value Date   ETH 28 (H) 10/17/2016   ETH <5 08/15/2016    Metabolic Disorder Labs: No results found for: HGBA1C, MPG No results found for: PROLACTIN No results found for: CHOL, TRIG, HDL, CHOLHDL,  VLDL, LDLCALC  Physical Findings: AIMS: Facial and Oral Movements Muscles of Facial Expression: None, normal Lips and Perioral Area: None, normal Jaw: None, normal Tongue: None, normal,Extremity Movements Upper (arms, wrists, hands, fingers): None, normal, Trunk Movements Neck, shoulders, hips: None, normal, Overall Severity Severity of abnormal movements (highest score from questions above): None, normal Incapacitation due to abnormal movements: None, normal Patient's awareness of abnormal movements (rate only patient's report): No Awareness, Dental Status Current problems with teeth and/or dentures?: No Does patient usually wear dentures?: No  CIWA:  CIWA-Ar Total: 5 COWS:  COWS Total Score: 7  Musculoskeletal: Strength & Muscle Tone: within normal limits Gait & Station: normal Patient leans: N/A  Psychiatric Specialty Exam: Physical Exam  Nursing note and vitals reviewed.   Review of Systems  Constitutional: Positive for diaphoresis and malaise/fatigue.  Gastrointestinal: Positive for nausea.  Neurological: Positive for tremors.  Psychiatric/Behavioral: Positive for depression, substance abuse and suicidal  ideas.  All other systems reviewed and are negative.   Blood pressure 122/76, pulse 87, temperature 98.2 F (36.8 C), temperature source Oral, resp. rate 18, height 6' 1" (1.854 m), weight 86.6 kg (191 lb), SpO2 97 %.Body mass index is 25.2 kg/m.  General Appearance: Disheveled  Eye Contact:  Minimal  Speech:  Slow  Volume:  Decreased  Mood:  Depressed, Hopeless and Worthless  Affect:  Blunt  Thought Process:  Goal Directed and Descriptions of Associations: Intact  Orientation:  Full (Time, Place, and Person)  Thought Content:  WDL  Suicidal Thoughts:  Yes.  with intent/plan  Homicidal Thoughts:  No  Memory:  Immediate;   Fair Recent;   Fair Remote;   Fair  Judgement:  Impaired  Insight:  Lacking  Psychomotor Activity:  Psychomotor Retardation  Concentration:  Concentration: Fair and Attention Span: Fair  Recall:  [employer] of Knowledge:  Fair  Language:  Fair  Akathisia:  No  Handed:  Right  AIMS (if indicated):     Assets:  Communication Skills Desire for Improvement Physical Health Resilience  ADL's:  Intact  Cognition:  WNL  Sleep:  Number of Hours: 7.75     Treatment Plan Summary: Daily contact with patient to assess and evaluate symptoms and progress in treatment and Medication management   Scott Brock is a 35 year old male with history of opiate dependence and depression admitted for suicidal ideation in the context of substance use.  1. Suicidal ideation. The patient is able to contract for safety in the hospital.  2. Mood. The patient is not interested in pharmacotherapy.   3. Opiate detox. We started symptomatic treatment for opioid withdrawal.   4. Smoking. Nicotine patch is available.   5. Insomnia. Trazodone is available.   6. Substance abuse treatment. The patient is unable to discuss treatment options.   7. Alcohol detox. Ativan is available for CIWA >8.   8. Disposition. TBE.    Orson Slick, MD 10/19/2016, 12:44 PM

## 2016-10-19 NOTE — Plan of Care (Signed)
Problem: Health Behavior/Discharge Planning: Goal: Identification of resources available to assist in meeting health care needs will improve Outcome: Progressing Pt states he plans to be discharged to attend [facility], a 9 month program for drugs/alcohol. States "I talk to them everyday. I know they will let me come back"

## 2016-10-19 NOTE — Plan of Care (Signed)
Problem: Safety: Goal: Periods of time without injury will increase Outcome: Progressing Patient has been without injury during this shift.    

## 2016-10-19 NOTE — BHH Counselor (Deleted)
Adult Comprehensive Assessment  Patient ID: Toribio HarbourMark J Billiter, male   DOB: 09-21-1981, 35 y.o.   MRN: [id number]  Information Source: Information source: Patient  Current Stressors:     Living/Environment/Situation:  Living Arrangements: Alone  Family History:     Childhood History:     Education:     Employment/Work Situation:      Surveyor, quantityinancial Resources:      Alcohol/Substance Abuse:      Social Support System:      Leisure/Recreation:      Strengths/Needs:      Discharge Plan:      Summary/Recommendations:   Summary and Recommendations (to be completed by the evaluator): Patient is a 35 year old male who presented to the hospital voluntarily and was admitted for suicidal ideations with a plan.  Pt's primary diagnosis is Major depressive disorder, single without psychotic features (HCC).  Pt reports primary triggers for admission were the pt's desire to detox from substances, coupled with the pt's inability to find satisfactory treatment options.  Pt reports his stressors are homelessness and substance abuse.  Pt now denies SI/HI/AVH.  [place]  Pt lists supports in the community as his best friend, his father and mother.  Patient will benefit from crisis stabilization, medication evaluation, group therapy, and psycho education in addition to case management for discharge planning. Patient and CSW reviewed pt's identified goals and treatment plan. Pt verbalized understanding and agreed to treatment plan.  At discharge it is recommended that patient remain compliant with established plan and continue treatment.  Dorothe PeaJonathan F Adreona Brand. 10/19/2016

## 2016-10-19 NOTE — Tx Team (Signed)
Interdisciplinary Treatment and Diagnostic Plan Update  10/19/2016 Time of Session: 10:30am Scott Brock MRN: [id number]  Principal Diagnosis: Severe episode of recurrent major depressive disorder, without psychotic features (HCC)  Secondary Diagnoses: Principal Problem:   Severe episode of recurrent major depressive disorder, without psychotic features (HCC) Active Problems:   Opioid use disorder, severe, dependence (HCC)   Tobacco use disorder   Substance induced mood disorder (HCC)   Alcohol use disorder, moderate, dependence (HCC)   Involuntary commitment   Suicidal ideation   Current Medications:  Current Facility-Administered Medications  Medication Dose Route Frequency Provider Last Rate Last Dose  . acetaminophen (TYLENOL) tablet 650 mg  650 mg Oral Q6H PRN Audery AmelJohn T Clapacs, MD      . acyclovir (ZOVIRAX) 200 MG capsule 1,000 mg  1,000 mg Oral BID Shari ProwsJolanta B Pucilowska, MD   1,000 mg at 10/19/16 0853  . alum & mag hydroxide-simeth (MAALOX/MYLANTA) 200-200-20 MG/5ML suspension 30 mL  30 mL Oral Q4H PRN Audery AmelJohn T Clapacs, MD      . clindamycin (CLEOCIN) capsule 300 mg  300 mg Oral Q8H Audery AmelJohn T Clapacs, MD   300 mg at 10/19/16 [id number]  . folic acid (FOLVITE) tablet 1 mg  1 mg Oral Daily Audery AmelJohn T Clapacs, MD   1 mg at 10/18/16 1413  . hydrOXYzine (ATARAX/VISTARIL) tablet 25 mg  25 mg Oral TID PRN Audery AmelJohn T Clapacs, MD   25 mg at 10/19/16 0855  . loperamide (IMODIUM) capsule 2 mg  2 mg Oral PRN Audery AmelJohn T Clapacs, MD      . LORazepam (ATIVAN) tablet 1 mg  1 mg Oral Q6H PRN Audery AmelJohn T Clapacs, MD   1 mg at 10/18/16 2143  . magnesium hydroxide (MILK OF MAGNESIA) suspension 30 mL  30 mL Oral Daily PRN Audery AmelJohn T Clapacs, MD      . methocarbamol (ROBAXIN) tablet 750 mg  750 mg Oral Q6H PRN Audery AmelJohn T Clapacs, MD   750 mg at 10/17/16 2351  . multivitamin with minerals tablet 1 tablet  1 tablet Oral Daily Audery AmelJohn T Clapacs, MD   1 tablet at 10/18/16 1414  . ondansetron (ZOFRAN) tablet 4 mg  4 mg Oral Q8H PRN Audery AmelJohn T  Clapacs, MD      . thiamine (VITAMIN B-1) tablet 100 mg  100 mg Oral Daily Audery AmelJohn T Clapacs, MD   100 mg at 10/18/16 1413   Or  . thiamine (B-1) injection 100 mg  100 mg Intravenous Daily Audery AmelJohn T Clapacs, MD      . traZODone (DESYREL) tablet 100 mg  100 mg Oral QHS PRN Audery AmelJohn T Clapacs, MD   100 mg at 10/18/16 2141   PTA Medications: Prescriptions Prior to Admission  Medication Sig Dispense Refill Last Dose  . gabapentin (NEURONTIN) 100 MG capsule Take 2 capsules (200 mg total) by mouth 3 (three) times daily. For agitation 180 capsule 0   . hydrOXYzine (ATARAX/VISTARIL) 25 MG tablet Take 1 tablet (25 mg total) by mouth every 6 (six) hours as needed for anxiety. 60 tablet 0   . nicotine polacrilex (NICORETTE) 2 MG gum Take 1 each (2 mg total) by mouth as needed for smoking cessation. 100 tablet 0   . traZODone (DESYREL) 150 MG tablet Take 1 tablet (150 mg total) by mouth at bedtime. For insomnia 30 tablet 0   . valACYclovir (VALTREX) 1000 MG tablet Take 1 tablet (1,000 mg total) by mouth 2 (two) times daily as needed (outbreak). Herpes outbreak  Patient Stressors: Financial difficulties Health problems Substance abuse  Patient Strengths: Ability for Warden/rangerinsight Communication skills Motivation for treatment/growth  Treatment Modalities: Medication Management, Group therapy, Case management,  1 to 1 session with clinician, Psychoeducation, Recreational therapy.   Physician Treatment Plan for Primary Diagnosis: Severe episode of recurrent major depressive disorder, without psychotic features (HCC) Long Term Goal(s): Improvement in symptoms so as ready for discharge Improvement in symptoms so as ready for discharge   Short Term Goals: Ability to identify changes in lifestyle to reduce recurrence of condition will improve Ability to verbalize feelings will improve Ability to disclose and discuss suicidal ideas Ability to demonstrate self-control will improve Ability to identify and develop  effective coping behaviors will improve Ability to maintain clinical measurements within normal limits will improve Compliance with prescribed medications will improve Ability to identify changes in lifestyle to reduce recurrence of condition will improve Ability to demonstrate self-control will improve Ability to identify triggers associated with substance abuse/mental health issues will improve  Medication Management: Evaluate patient's response, side effects, and tolerance of medication regimen.  Therapeutic Interventions: 1 to 1 sessions, Unit Group sessions and Medication administration.  Evaluation of Outcomes: Progressing  Physician Treatment Plan for Secondary Diagnosis: Principal Problem:   Severe episode of recurrent major depressive disorder, without psychotic features (HCC) Active Problems:   Opioid use disorder, severe, dependence (HCC)   Tobacco use disorder   Substance induced mood disorder (HCC)   Alcohol use disorder, moderate, dependence (HCC)   Involuntary commitment   Suicidal ideation  Long Term Goal(s): Improvement in symptoms so as ready for discharge Improvement in symptoms so as ready for discharge   Short Term Goals: Ability to identify changes in lifestyle to reduce recurrence of condition will improve Ability to verbalize feelings will improve Ability to disclose and discuss suicidal ideas Ability to demonstrate self-control will improve Ability to identify and develop effective coping behaviors will improve Ability to maintain clinical measurements within normal limits will improve Compliance with prescribed medications will improve Ability to identify changes in lifestyle to reduce recurrence of condition will improve Ability to demonstrate self-control will improve Ability to identify triggers associated with substance abuse/mental health issues will improve     Medication Management: Evaluate patient's response, side effects, and tolerance of  medication regimen.  Therapeutic Interventions: 1 to 1 sessions, Unit Group sessions and Medication administration.  Evaluation of Outcomes: Progressing   RN Treatment Plan for Primary Diagnosis: Severe episode of recurrent major depressive disorder, without psychotic features (HCC) Long Term Goal(s): Knowledge of disease and therapeutic regimen to maintain health will improve  Short Term Goals: Ability to remain free from injury will improve, Ability to verbalize frustration and anger appropriately will improve, Ability to participate in decision making will improve, Ability to identify and develop effective coping behaviors will improve and Compliance with prescribed medications will improve  Medication Management: RN will administer medications as ordered by provider, will assess and evaluate patient's response and provide education to patient for prescribed medication. RN will report any adverse and/or side effects to prescribing provider.  Therapeutic Interventions: 1 on 1 counseling sessions, Psychoeducation, Medication administration, Evaluate responses to treatment, Monitor vital signs and CBGs as ordered, Perform/monitor CIWA, COWS, AIMS and Fall Risk screenings as ordered, Perform wound care treatments as ordered.  Evaluation of Outcomes: Progressing   LCSW Treatment Plan for Primary Diagnosis: Severe episode of recurrent major depressive disorder, without psychotic features (HCC) Long Term Goal(s): Safe transition to appropriate next level of care at  discharge, Engage patient in therapeutic group addressing interpersonal concerns.  Short Term Goals: Engage patient in aftercare planning with referrals and resources, Increase social support, Increase ability to appropriately verbalize feelings, Increase emotional regulation, Facilitate acceptance of mental health diagnosis and concerns, Facilitate patient progression through stages of change regarding substance use diagnoses and  concerns, Identify triggers associated with mental health/substance abuse issues and Increase skills for wellness and recovery  Therapeutic Interventions: Assess for all discharge needs, 1 to 1 time with Social worker, Explore available resources and support systems, Assess for adequacy in community support network, Educate family and significant other(s) on suicide prevention, Complete Psychosocial Assessment, Interpersonal group therapy.  Evaluation of Outcomes: Progressing   Progress in Treatment: Attending groups: No. Participating in groups: No. Taking medication as prescribed: Yes. Toleration medication: Yes. Family/Significant other contact made: No, will contact:  pt's mother Patient understands diagnosis: Yes. Discussing patient identified problems/goals with staff: Yes. Medical problems stabilized or resolved: Yes. Denies suicidal/homicidal ideation: No. Pt would not answer Issues/concerns per patient self-inventory: Yes. Other: none listed  New problem(s) identified: No, Describe:  none listed  New Short Term/Long Term Goal(s):  Discharge Plan or Barriers:  CSW still assessing for appropriate referrals  Reason for Continuation of Hospitalization: Anxiety Depression Suicidal ideation Withdrawal symptoms  Estimated Length of Stay: 3-5 days  Attendees: Patient:Scott Brock 10/19/2016 10:46 AM  Physician: Dr. Jennet Maduro, MD 10/19/2016 10:46 AM  Nursing: Hulan Amato, RN 10/19/2016 10:46 AM  RN Care Manager: 10/19/2016 10:46 AM  Social Worker: Dorothe Pea. Roylene Reason, LCAS 10/19/2016 10:46 AM  Recreational Therapist: Hershal Coria, LRT 10/19/2016 10:46 AM  Other:  10/19/2016 10:46 AM  Other:  10/19/2016 10:46 AM  Other: 10/19/2016 10:46 AM    Scribe for Treatment Team: Dorothe Pea Maylynn Orzechowski, LCSWA 10/19/2016 10:46 AM

## 2016-10-20 MED ORDER — VALACYCLOVIR HCL 500 MG PO TABS
1000.0000 mg | ORAL_TABLET | Freq: Two times a day (BID) | ORAL | Status: DC
  Administered 2016-10-20 – 2016-10-21 (×2): 1000 mg via ORAL
  Filled 2016-10-20 (×2): qty 2

## 2016-10-20 MED ORDER — LORAZEPAM 1 MG PO TABS
1.0000 mg | ORAL_TABLET | Freq: Three times a day (TID) | ORAL | Status: DC | PRN
  Administered 2016-10-20 (×2): 1 mg via ORAL
  Filled 2016-10-20 (×2): qty 1

## 2016-10-20 NOTE — Progress Notes (Signed)
Recreation Therapy Notes  Date: 11.08.17 Time: 9:30 am Location: Craft Room  Group Topic: Self-esteem  Goal Area(s) Addresses:  Patient will write at least one positive trait about self. Patient will verbalize benefit of having a healthy self-esteem.  Behavioral Response: Did not attend  Intervention: I Am  Activity: Patients were given a worksheet with the letter I on it and were instructed to write as many positive traits about themselves inside the letter.  Education: LRT educated patients on ways they can increase their self-esteem.  Education Outcome: Patient did not attend group.  Clinical Observations/Feedback: Patient did not attend group.  Jacquelynn CreeGreene,Kieran Nachtigal M, LRT/CTRS 10/20/2016 11:48 AM

## 2016-10-20 NOTE — [facility] Group Notes (Signed)
[facility] Group Notes:  (Nursing/MHT/Case Management/Adjunct)  Date:  10/20/2016  Time:  4:06 PM  Type of Therapy:  Psychoeducational Skills  Participation Level:  Active  Participation Quality:  Appropriate, Sharing and Supportive  Affect:  Appropriate  Cognitive:  Appropriate  Insight:  Appropriate  Engagement in Group:  Engaged and Supportive  Modes of Intervention:  Discussion, Education and Support  Summary of Progress/Problems:  Scott PennerKristen J Stalin Gruenberg 10/20/2016, 4:06 PM

## 2016-10-20 NOTE — Progress Notes (Addendum)
[facility] MD Progress Note  10/20/2016 11:57 AM Scott Brock  MRN:  [id number]  Subjective:  Scott Brock continues to improve. He reports only mild symptoms of withdrawal. Vital signs are stable. His mood is improving, affect is brighter. He is out of his room visible in the milieu. He has been Education officer, environmental every day and hopes to be accepted into their program again. They do not want any psychotropic medications. Anticipated discharge tomorrow.   Principal Problem: Severe episode of recurrent major depressive disorder, without psychotic features (HCC)  Diagnosis:   Patient Active Problem List   Diagnosis Date Noted  . Suicidal ideation [R45.851] 10/17/2016  . Severe episode of recurrent major depressive disorder, without psychotic features (HCC) [F33.2] 08/16/2016  . Substance induced mood disorder (HCC) [F19.94] 07/22/2016  . Alcohol use disorder, moderate, dependence (HCC) [F10.20] 07/22/2016  . Involuntary commitment [Z04.6] 07/22/2016  . Opioid use disorder, severe, dependence (HCC) [F11.20] 03/20/2016  . Tobacco use disorder [F17.200] 03/20/2016   Total Time spent with patient: 20 minutes  Past Psychiatric History: substance abuse.  Past Medical History:  Past Medical History:  Diagnosis Date  . Acid reflux   . Asthma   . Chronic back pain   . Renal disorder    Kidney stone  . Status post dilation of esophageal narrowing     Past Surgical History:  Procedure Laterality Date  . APPENDECTOMY    . RHINOPLASTY    . UPPER ENDOSCOPY W/ ESOPHAGEAL MANOMETRY     Family History: History reviewed. No pertinent family history. Family Psychiatric  History: See H&P. Social History:  History  Alcohol Use  . Yes    Comment: social     History  Drug Use  . Types: IV, Heroin    Comment: heroin    Social History   Social History  . Marital status: Single    Spouse name: N/A  . Number of children: N/A  . Years of education: N/A   Social History Main Topics  .  Smoking status: Current Every Day Smoker    Packs/day: 0.50    Years: 15.00    Types: Cigarettes  . Smokeless tobacco: Never Used  . Alcohol use Yes     Comment: social  . Drug use:     Types: IV, Heroin     Comment: heroin  . Sexual activity: Yes    Birth control/ protection: None   Other Topics Concern  . None   Social History Narrative  . None   Additional Social History:                         Sleep: Fair  Appetite:  Fair  Current Medications: Current Facility-Administered Medications  Medication Dose Route Frequency Provider Last Rate Last Dose  . acetaminophen (TYLENOL) tablet 650 mg  650 mg Oral Q6H PRN Audery Amel, MD   650 mg at 10/19/16 1737  . alum & mag hydroxide-simeth (MAALOX/MYLANTA) 200-200-20 MG/5ML suspension 30 mL  30 mL Oral Q4H PRN Audery Amel, MD      . clindamycin (CLEOCIN) capsule 300 mg  300 mg Oral Q8H Audery Amel, MD   300 mg at 10/20/16 0646  . folic acid (FOLVITE) tablet 1 mg  1 mg Oral Daily Audery Amel, MD   1 mg at 10/18/16 1413  . hydrOXYzine (ATARAX/VISTARIL) tablet 25 mg  25 mg Oral TID PRN Audery Amel, MD   25 mg  at 10/20/16 0808  . loperamide (IMODIUM) capsule 2 mg  2 mg Oral PRN Audery AmelJohn T Clapacs, MD      . LORazepam (ATIVAN) tablet 1 mg  1 mg Oral Q6H PRN Audery AmelJohn T Clapacs, MD   1 mg at 10/20/16 0648  . magnesium hydroxide (MILK OF MAGNESIA) suspension 30 mL  30 mL Oral Daily PRN Audery AmelJohn T Clapacs, MD      . methocarbamol (ROBAXIN) tablet 750 mg  750 mg Oral Q6H PRN Audery AmelJohn T Clapacs, MD   750 mg at 10/17/16 2351  . multivitamin with minerals tablet 1 tablet  1 tablet Oral Daily Audery AmelJohn T Clapacs, MD   1 tablet at 10/18/16 1414  . ondansetron (ZOFRAN) tablet 4 mg  4 mg Oral Q8H PRN Audery AmelJohn T Clapacs, MD      . thiamine (VITAMIN B-1) tablet 100 mg  100 mg Oral Daily Audery AmelJohn T Clapacs, MD   100 mg at 10/18/16 1413   Or  . thiamine (B-1) injection 100 mg  100 mg Intravenous Daily Audery AmelJohn T Clapacs, MD      . traZODone (DESYREL) tablet  100 mg  100 mg Oral QHS PRN Audery AmelJohn T Clapacs, MD   100 mg at 10/19/16 2304  . valACYclovir (VALTREX) tablet 1,000 mg  1,000 mg Oral BID Floella Ensz B Blease Capaldi, MD        Lab Results:  No results found for this or any previous visit (from the past 48 hour(s)).  Blood Alcohol level:  Lab Results  Component Value Date   ETH 28 (H) 10/17/2016   ETH <5 08/15/2016    Metabolic Disorder Labs: No results found for: HGBA1C, MPG No results found for: PROLACTIN No results found for: CHOL, TRIG, HDL, CHOLHDL, VLDL, LDLCALC  Physical Findings: AIMS: Facial and Oral Movements Muscles of Facial Expression: None, normal Lips and Perioral Area: None, normal Jaw: None, normal Tongue: None, normal,Extremity Movements Upper (arms, wrists, hands, fingers): None, normal, Trunk Movements Neck, shoulders, hips: None, normal, Overall Severity Severity of abnormal movements (highest score from questions above): None, normal Incapacitation due to abnormal movements: None, normal Patient's awareness of abnormal movements (rate only patient's report): No Awareness, Dental Status Current problems with teeth and/or dentures?: No Does patient usually wear dentures?: No  CIWA:  CIWA-Ar Total: 5 COWS:  COWS Total Score: 7  Musculoskeletal: Strength & Muscle Tone: within normal limits Gait & Station: normal Patient leans: N/A  Psychiatric Specialty Exam: Physical Exam  Nursing note and vitals reviewed.   Review of Systems  Constitutional: Positive for diaphoresis and malaise/fatigue.  Gastrointestinal: Positive for nausea.  Neurological: Positive for tremors.  Psychiatric/Behavioral: Positive for depression, substance abuse and suicidal ideas.  All other systems reviewed and are negative.   Blood pressure 119/74, pulse 65, temperature 98 F (36.7 C), temperature source Oral, resp. rate 18, height 6\' 1"  (1.854 m), weight 86.6 kg (191 lb), SpO2 97 %.Body mass index is 25.2 kg/m.  General Appearance:  Disheveled  Eye Contact:  Minimal  Speech:  Slow  Volume:  Decreased  Mood:  Depressed, Hopeless and Worthless  Affect:  Blunt  Thought Process:  Goal Directed and Descriptions of Associations: Intact  Orientation:  Full (Time, Place, and Person)  Thought Content:  WDL  Suicidal Thoughts:  Yes.  with intent/plan  Homicidal Thoughts:  No  Memory:  Immediate;   Fair Recent;   Fair Remote;   Fair  Judgement:  Impaired  Insight:  Lacking  Psychomotor Activity:  Psychomotor Retardation  Concentration:  Concentration: Fair and Attention Span: Fair  Recall:  FiservFair  Fund of Knowledge:  Fair  Language:  Fair  Akathisia:  No  Handed:  Right  AIMS (if indicated):     Assets:  Communication Skills Desire for Improvement Physical Health Resilience  ADL's:  Intact  Cognition:  WNL  Sleep:  Number of Hours: 6.75     Treatment Plan Summary: Daily contact with patient to assess and evaluate symptoms and progress in treatment and Medication management   Scott Brock is a 35 year old male with history of opiate dependence and depression admitted for suicidal ideation in the context of substance use.  1. Suicidal ideation. The patient is able to contract for safety in the hospital.  2. Mood. The patient is not interested in pharmacotherapy.   3. Opiate detox. We started symptomatic treatment for opioid withdrawal.   4. Smoking. Nicotine patch is available.   5. Insomnia. Trazodone is available.   6. Substance abuse treatment. The patient is unable to discuss treatment options.   7. Alcohol detox. We will stop Ativan. Vital signs are stable.    8. Disposition. He will be discharged [facility] [place]    Kristine LineaJolanta Evelette Hollern, MD 10/20/2016, 11:57 AM

## 2016-10-20 NOTE — [facility] Group Notes (Signed)
[facility] Group Notes:  (Nursing/MHT/Case Management/Adjunct)  Date:  10/20/2016  Time:  5:15 AM  Type of Therapy:  Psychoeducational Skills  Participation Level:  Did Not Attend  Summary of Progress/Problems:  Chancy MilroyLaquanda Y Geriann Lafont 10/20/2016, 5:15 AM

## 2016-10-20 NOTE — Progress Notes (Signed)
Affect flat.  Denies SI/HI/AVH.  Presents frequently to nurses station requesting anxiety medication.  Preoccupied with having zovirax changed to valtrex.   Support and encouragement offered.  Safety maintained.

## 2016-10-20 NOTE — Plan of Care (Signed)
Problem: Coping: Goal: Ability to cope will improve Outcome: Not Progressing Presents frequently requesting medications for anxiety.  No outward signs of anxiety noted.  Unable to verbalize what is causing anxiety or any coping skills.

## 2016-10-20 NOTE — Plan of Care (Signed)
Problem: Safety: Goal: Periods of time without injury will increase Outcome: Progressing Pt remains free from harm.  Problem: Self-Concept: Goal: Level of anxiety will decrease Outcome: Not Progressing Pt reports increased anxiety and requests PRN medication.

## 2016-10-20 NOTE — Progress Notes (Signed)
D: Pt is isolative to his room this evening. Minimal interaction with peers noted. Pt maintains poor eye contact. He rates depression and anxiety 4/10. Requests PRN medication appropriately. Denies SI/HI/AVH at this time. A: Emotional support and encouragement provided. Medications administered with education. q15 minute safety checks maintained. R: Pt remains free from harm. Will continue to monitor.

## 2016-10-21 MED ORDER — CLINDAMYCIN HCL 300 MG PO CAPS: 300.0000 mg | ORAL_CAPSULE | Freq: Three times a day (TID) | ORAL | 1 refills | Status: DC

## 2016-10-21 MED ORDER — CHLORDIAZEPOXIDE HCL 25 MG PO CAPS
25.0000 mg | ORAL_CAPSULE | Freq: Four times a day (QID) | ORAL | Status: DC
  Administered 2016-10-21 (×2): 25 mg via ORAL
  Filled 2016-10-21 (×2): qty 1

## 2016-10-21 MED ORDER — VALACYCLOVIR HCL 1 G PO TABS: 1000.0000 mg | ORAL_TABLET | Freq: Two times a day (BID) | ORAL | 1 refills | Status: DC | PRN

## 2016-10-21 NOTE — Progress Notes (Signed)
Recreation Therapy Notes  Date: 11.09.17 Time: 9:30 am Location: Craft Room  Group Topic: Leisure Education  Goal Area(s) Addresses:  Patient will identify activities for each letter of the alphabet. Patient will verbalize ability to integrate positive leisure into life post d/c. Patient will verbalize ability to use leisure as a Associate Professorcoping skill.  Behavioral Response: Did not attend  Intervention: Leisure Alphabet  Activity: Patients were given a Leisure Information systems managerAlphabet worksheet and were instructed to write healthy leisure activities for each letter of the alphabet.  Education: LRT educated patients on what they need to participate in leisure.  Education Outcome: Patient did not attend group.   Clinical Observations/Feedback: Patient did not attend group.  Jacquelynn CreeGreene,Eutha Cude M, LRT/CTRS 10/21/2016 10:28 AM

## 2016-10-21 NOTE — Tx Team (Addendum)
Interdisciplinary Treatment and Diagnostic Plan Update  10/21/2016 Time of Session: 10:30am Scott Brock MRN: [id number]  Principal Diagnosis: Severe episode of recurrent major depressive disorder, without psychotic features (HCC)  Secondary Diagnoses: Principal Problem:   Severe episode of recurrent major depressive disorder, without psychotic features (HCC) Active Problems:   Opioid use disorder, severe, dependence (HCC)   Tobacco use disorder   Substance induced mood disorder (HCC)   Alcohol use disorder, moderate, dependence (HCC)   Involuntary commitment   Suicidal ideation   Current Medications:  Current Facility-Administered Medications  Medication Dose Route Frequency Provider Last Rate Last Dose  . acetaminophen (TYLENOL) tablet 650 mg  650 mg Oral Q6H PRN Audery Amel, MD   650 mg at 10/19/16 1737  . alum & mag hydroxide-simeth (MAALOX/MYLANTA) 200-200-20 MG/5ML suspension 30 mL  30 mL Oral Q4H PRN Audery Amel, MD      . chlordiazePOXIDE (LIBRIUM) capsule 25 mg  25 mg Oral QID Shari Prows, MD   25 mg at 10/21/16 0820  . clindamycin (CLEOCIN) capsule 300 mg  300 mg Oral Q8H Audery Amel, MD   300 mg at 10/20/16 2128  . folic acid (FOLVITE) tablet 1 mg  1 mg Oral Daily Audery Amel, MD   1 mg at 10/21/16 0820  . hydrOXYzine (ATARAX/VISTARIL) tablet 25 mg  25 mg Oral TID PRN Audery Amel, MD   25 mg at 10/20/16 2128  . loperamide (IMODIUM) capsule 2 mg  2 mg Oral PRN Audery Amel, MD      . magnesium hydroxide (MILK OF MAGNESIA) suspension 30 mL  30 mL Oral Daily PRN Audery Amel, MD      . methocarbamol (ROBAXIN) tablet 750 mg  750 mg Oral Q6H PRN Audery Amel, MD   750 mg at 10/17/16 2351  . multivitamin with minerals tablet 1 tablet  1 tablet Oral Daily Audery Amel, MD   1 tablet at 10/21/16 0820  . ondansetron (ZOFRAN) tablet 4 mg  4 mg Oral Q8H PRN Audery Amel, MD      . thiamine (VITAMIN B-1) tablet 100 mg  100 mg Oral Daily Audery Amel,  MD   100 mg at 10/21/16 0820   Or  . thiamine (B-1) injection 100 mg  100 mg Intravenous Daily Audery Amel, MD      . traZODone (DESYREL) tablet 100 mg  100 mg Oral QHS PRN Audery Amel, MD   100 mg at 10/20/16 2134  . valACYclovir (VALTREX) tablet 1,000 mg  1,000 mg Oral BID Shari Prows, MD   1,000 mg at 10/21/16 0820   PTA Medications: Prescriptions Prior to Admission  Medication Sig Dispense Refill Last Dose  . gabapentin (NEURONTIN) 100 MG capsule Take 2 capsules (200 mg total) by mouth 3 (three) times daily. For agitation 180 capsule 0   . hydrOXYzine (ATARAX/VISTARIL) 25 MG tablet Take 1 tablet (25 mg total) by mouth every 6 (six) hours as needed for anxiety. 60 tablet 0   . nicotine polacrilex (NICORETTE) 2 MG gum Take 1 each (2 mg total) by mouth as needed for smoking cessation. 100 tablet 0   . traZODone (DESYREL) 150 MG tablet Take 1 tablet (150 mg total) by mouth at bedtime. For insomnia 30 tablet 0   . [DISCONTINUED] valACYclovir (VALTREX) 1000 MG tablet Take 1 tablet (1,000 mg total) by mouth 2 (two) times daily as needed (outbreak). Herpes outbreak  Patient Stressors: Financial difficulties Health problems Substance abuse  Patient Strengths: Ability for Warden/rangerinsight Communication skills Motivation for treatment/growth  Treatment Modalities: Medication Management, Group therapy, Case management,  1 to 1 session with clinician, Psychoeducation, Recreational therapy.   Physician Treatment Plan for Primary Diagnosis: Severe episode of recurrent major depressive disorder, without psychotic features (HCC) Long Term Goal(s): Improvement in symptoms so as ready for discharge Improvement in symptoms so as ready for discharge   Short Term Goals: Ability to identify changes in lifestyle to reduce recurrence of condition will improve Ability to verbalize feelings will improve Ability to disclose and discuss suicidal ideas Ability to demonstrate self-control will  improve Ability to identify and develop effective coping behaviors will improve Ability to maintain clinical measurements within normal limits will improve Compliance with prescribed medications will improve Ability to identify changes in lifestyle to reduce recurrence of condition will improve Ability to demonstrate self-control will improve Ability to identify triggers associated with substance abuse/mental health issues will improve  Medication Management: Evaluate patient's response, side effects, and tolerance of medication regimen.  Therapeutic Interventions: 1 to 1 sessions, Unit Group sessions and Medication administration.  Evaluation of Outcomes: Adequate for discharge  Physician Treatment Plan for Secondary Diagnosis: Principal Problem:   Severe episode of recurrent major depressive disorder, without psychotic features (HCC) Active Problems:   Opioid use disorder, severe, dependence (HCC)   Tobacco use disorder   Substance induced mood disorder (HCC)   Alcohol use disorder, moderate, dependence (HCC)   Involuntary commitment   Suicidal ideation  Long Term Goal(s): Improvement in symptoms so as ready for discharge Improvement in symptoms so as ready for discharge   Short Term Goals: Ability to identify changes in lifestyle to reduce recurrence of condition will improve Ability to verbalize feelings will improve Ability to disclose and discuss suicidal ideas Ability to demonstrate self-control will improve Ability to identify and develop effective coping behaviors will improve Ability to maintain clinical measurements within normal limits will improve Compliance with prescribed medications will improve Ability to identify changes in lifestyle to reduce recurrence of condition will improve Ability to demonstrate self-control will improve Ability to identify triggers associated with substance abuse/mental health issues will improve     Medication Management: Evaluate  patient's response, side effects, and tolerance of medication regimen.  Therapeutic Interventions: 1 to 1 sessions, Unit Group sessions and Medication administration.  Evaluation of Outcomes: Adequate for discharge   RN Treatment Plan for Primary Diagnosis: Severe episode of recurrent major depressive disorder, without psychotic features (HCC) Long Term Goal(s): Knowledge of disease and therapeutic regimen to maintain health will improve  Short Term Goals: Ability to remain free from injury will improve, Ability to verbalize frustration and anger appropriately will improve, Ability to participate in decision making will improve, Ability to identify and develop effective coping behaviors will improve and Compliance with prescribed medications will improve  Medication Management: RN will administer medications as ordered by provider, will assess and evaluate patient's response and provide education to patient for prescribed medication. RN will report any adverse and/or side effects to prescribing provider.  Therapeutic Interventions: 1 on 1 counseling sessions, Psychoeducation, Medication administration, Evaluate responses to treatment, Monitor vital signs and CBGs as ordered, Perform/monitor CIWA, COWS, AIMS and Fall Risk screenings as ordered, Perform wound care treatments as ordered.  Evaluation of Outcomes: Adequate for discharge   LCSW Treatment Plan for Primary Diagnosis: Severe episode of recurrent major depressive disorder, without psychotic features (HCC) Long Term Goal(s): Safe transition to  appropriate next level of care at discharge, Engage patient in therapeutic group addressing interpersonal concerns.  Short Term Goals: Engage patient in aftercare planning with referrals and resources, Increase social support, Increase ability to appropriately verbalize feelings, Increase emotional regulation, Facilitate acceptance of mental health diagnosis and concerns, Facilitate patient  progression through stages of change regarding substance use diagnoses and concerns, Identify triggers associated with mental health/substance abuse issues and Increase skills for wellness and recovery  Therapeutic Interventions: Assess for all discharge needs, 1 to 1 time with Social worker, Explore available resources and support systems, Assess for adequacy in community support network, Educate family and significant other(s) on suicide prevention, Complete Psychosocial Assessment, Interpersonal group therapy.  Evaluation of Outcomes: Adequate for discharge   Progress in Treatment: Attending groups: No. Participating in groups: No. Taking medication as prescribed: Yes. Toleration medication: Yes. Family/Significant other contact made: No, will contact:  pt's mother Patient understands diagnosis: Yes. Discussing patient identified problems/goals with staff: Yes. Medical problems stabilized or resolved: Yes. Denies suicidal/homicidal ideation: No. Pt would not answer Issues/concerns per patient self-inventory: Yes. Other: none listed  New problem(s) identified: No, Describe:  none listed  New Short Term/Long Term Goal(s):  Discharge Plan or Barriers: Pt will discharge to [facility] [place] [place] [facility] [place] substance abuse treatment and therapy and will follow up with [facility] for medication management and therapy.   Reason for Continuation of Hospitalization: Anxiety Depression Suicidal ideation Withdrawal symptoms  Estimated date of discharge: 10/21/16   Attendees: Margarite GougeMark Chlebowski Patient:Scott Brock, Scott Brock 10/21/2016 10:46 AM  Physician: Dr. Jennet MaduroPucilowska, MD 10/21/2016 10:46 AM  Nursing: Elenore PaddyJennifer Morrow, RN 10/21/2016 10:46 AM  RN Care Manager: 10/21/2016 10:46 AM  Social Worker: Dorothe PeaJonathan F. Roylene ReasonRiffey, LCSWA, LCAS 10/21/2016 10:46 AM  Recreational Therapist: Hershal CoriaBeth Greene, LRT 10/21/2016 10:46 AM  Other:  10/21/2016 10:46 AM  Other:  10/21/2016 10:46 AM  Other: 10/21/2016 10:46 AM     Scribe for Treatment Team: Dorothe PeaJonathan F Zienna Ahlin, LCSWA 10/21/2016 10:46 AM    10:46 AM

## 2016-10-21 NOTE — [facility] Suicide Risk Assessment (Signed)
[facility]   Principal Problem: Severe episode of recurrent major depressive disorder, without psychotic features [facility](HCC) Discharge Diagnoses:  Patient Active Problem List   Diagnosis Date Noted  . Suicidal ideation [R45.851] 10/17/2016  . Severe episode of recurrent major depressive disorder, without psychotic features (HCC) [F33.2] 08/16/2016  . Substance induced mood disorder (HCC) [F19.94] 07/22/2016  . Alcohol use disorder, moderate, dependence (HCC) [F10.20] 07/22/2016  . Involuntary commitment [Z04.6] 07/22/2016  . Opioid use disorder, severe, dependence (HCC) [F11.20] 03/20/2016  . Tobacco use disorder [F17.200] 03/20/2016    Total Time spent with patient: 30 minutes  Musculoskeletal: Strength & Muscle Tone: within normal limits Gait & Station: normal Patient leans: N/A  Psychiatric Specialty Exam: Review of Systems  Psychiatric/Behavioral: Positive for substance abuse.  All other systems reviewed and are negative.   Blood pressure (!) 116/57, pulse 80, temperature 97.9 F (36.6 C), temperature source Oral, resp. rate 18, height 6\' 1"  (1.854 m), weight 86.6 kg (191 lb), SpO2 97 %.Body mass index is 25.2 kg/m.  General Appearance: Casual  Eye Contact::  Good  Speech:  Clear and Coherent409  Volume:  Normal  Mood:  Anxious  Affect:  Appropriate  Thought Process:  Goal Directed  Orientation:  Full (Time, Place, and Person)  Thought Content:  WDL  Suicidal Thoughts:  No  Homicidal Thoughts:  No  Memory:  Immediate;   Fair Recent;   Fair Remote;   Fair  Judgement:  Poor  Insight:  Lacking  Psychomotor Activity:  Normal  Concentration:  Fair  Recall:  FiservFair  Fund of Knowledge:Fair  Language: Fair  Akathisia:  No  Handed:  Right  AIMS (if indicated):     Assets:  Communication Skills Desire for Improvement Physical Health Resilience Social Support  Sleep:  Number of Hours: 2.75  Cognition: WNL  ADL's:  Intact   Mental Status Per  Nursing Assessment::   On Admission:     Demographic Factors:  Male, Adolescent or young adult, Caucasian, Low socioeconomic status and Unemployed  Loss Factors: Financial problems/change in socioeconomic status  Historical Factors: Family history of mental illness or substance abuse and Impulsivity  Risk Reduction Factors:   Sense of responsibility to family, Positive social support and Positive therapeutic relationship  Continued Clinical Symptoms:  Depression:   Comorbid alcohol abuse/dependence Impulsivity Alcohol/Substance Abuse/Dependencies  Cognitive Features That Contribute To Risk:  None    Suicide Risk:  Minimal: No identifiable suicidal ideation.  Patients presenting with no risk factors but with morbid ruminations; may be classified as minimal risk based on the severity of the depressive symptoms  Follow-up Information    [facility] Follow up.   Why:  Please arrive at the office at discharge or email them at: [email or web address] to seek admittance for residential substance abuse treatment and therapy Contact information: [facility] [address] [place], [place] [address] Ph: [phone] Do not fax;Fax not needed          Plan Of Care/Follow-up recommendations:  Activity:  as tolerated. Diet:  regular. Other:  keep follow up appointments.  Kristine LineaJolanta Adana Marik, MD 10/21/2016, 9:25 AM

## 2016-10-21 NOTE — [facility] Group Notes (Signed)
[facility] LCSW Group Therapy Note  Type of Therapy and Topic:  Group Therapy:  Goals Group: SMART Goals  Participation Level:  Patient did not attend group. CSW invited patient to group.   Description of Group:   The purpose of a daily goals group is to assist and guide patients in setting recovery/wellness-related goals.  The objective is to set goals as they relate to the crisis in which they were admitted. Patients will be using SMART goal modalities to set measurable goals.  Characteristics of realistic goals will be discussed and patients will be assisted in setting and processing how one will reach their goal. Facilitator will also assist patients in applying interventions and coping skills learned in psycho-education groups to the SMART goal and process how one will achieve defined goal.  Therapeutic Goals: -Patients will develop and document one goal related to or their crisis in which brought them into treatment. -Patients will be guided by LCSW using SMART goal setting modality in how to set a measurable, attainable, realistic and time sensitive goal.  -Patients will process barriers in reaching goal. -Patients will process interventions in how to overcome and successful in reaching goal.   Summary of Patient Progress:  Patient Goal: Patient did not attend group. CSW invited patient to group.    Therapeutic Modalities:   Motivational Interviewing Engineer, manufacturing systemsCognitive Behavioral Therapy Crisis Intervention Model SMART goals setting  Cyrus Ramsburg G. Garnette CzechSampson MSW, LCSWA 10/21/2016 11:08 AM

## 2016-10-21 NOTE — Progress Notes (Signed)
Verbalizes understanding rt recommended discharge plan of care. To discharge when discharge transportation in here. Acknowledges all belongings have been returned. Safety maintained.

## 2016-10-21 NOTE — Discharge Summary (Signed)
Physician Discharge Summary Note  Patient:  Scott Brock is an 35 y.o., male MRN:  [id number] DOB:  Jan 21, 1981 Patient phone:  [phone] (home)  Patient address:   [address] [place] [id number],  Total Time spent with patient: 30 minutes  Date of Admission:  10/17/2016 Date of Discharge: 10/21/2016  Reason for Admission:  Suicidal ideation, detox.  Identifying data. Mr. Scott Brock is a 35 year old male with a history of opiate addiction and depression.   Chief complaint. "I need treatment."  History of present illness. Information was obtained from the patient and the chart. The patient has a long history of heroine and alcohol addiction with multiple attempts to treat, including suboxone clinic and residential substance abuse treatments. He has been hospitalized for depression and suicidal ideation as well. He came to the hospital depressed and suicidal in the context of active use. He reports poor sleep, decreased appetite with weight loss, feeling of guilt hopelessness worthlessness, poor energy and concentration, crying spells, social isolation, and heightened anxiety and suicidal thinking with a plan to overdose. He denies psychotic symptoms, symptoms suggestive of bipolar mania or other than alcohol and opioid substance use. During the interview, the patient is barely cooperating. He is physically suffering and not engaging in the interview.  Past psychiatric history. There is a long history of substance abuse. There were no suicide attempts. He did attended Suboxone clinic and completed rehabilitation and [place]. [place]  Family psychiatric history. Nonreported.  Social history. He is retired from [employer]. He is homeless now trying to get back to faith based substance abuse treatment program that he left prematurely.    Principal Problem: Severe episode of recurrent major depressive disorder, without psychotic  features [facility](HCC) Discharge Diagnoses: Patient Active Problem List   Diagnosis Date Noted  . Suicidal ideation [R45.851] 10/17/2016  . Severe episode of recurrent major depressive disorder, without psychotic features (HCC) [F33.2] 08/16/2016  . Substance induced mood disorder (HCC) [F19.94] 07/22/2016  . Alcohol use disorder, moderate, dependence (HCC) [F10.20] 07/22/2016  . Involuntary commitment [Z04.6] 07/22/2016  . Opioid use disorder, severe, dependence (HCC) [F11.20] 03/20/2016  . Tobacco use disorder [F17.200] 03/20/2016     Past Medical History:  Past Medical History:  Diagnosis Date  . Acid reflux   . Asthma   . Chronic back pain   . Renal disorder    Kidney stone  . Status post dilation of esophageal narrowing     Past Surgical History:  Procedure Laterality Date  . APPENDECTOMY    . RHINOPLASTY    . UPPER ENDOSCOPY W/ ESOPHAGEAL MANOMETRY     Family History: History reviewed. No pertinent family history.  Social History:  History  Alcohol Use  . Yes    Comment: social     History  Drug Use  . Types: IV, Heroin    Comment: heroin    Social History   Social History  . Marital status: Single    Spouse name: N/A  . Number of children: N/A  . Years of education: N/A   Social History Main Topics  . Smoking status: Current Every Day Smoker    Packs/day: 0.50    Years: 15.00    Types: Cigarettes  . Smokeless tobacco: Never Used  . Alcohol use Yes     Comment: social  . Drug use:     Types: IV, Heroin     Comment: heroin  .  Sexual activity: Yes    Birth control/ protection: None   Other Topics Concern  . None   Social History Narrative  . None    Hospital Course:    Mr. Scott Brock is a 35 year old male with history of opiate dependence and depression admitted for suicidal ideation in the context of substance use.  1. Suicidal ideation. Resolved. The patient is able to contract for safety. He is forward thinking and more optimistic about the  future.   2. Mood. The patient is not interested in pharmacotherapy.   3. Opiate detox. He was treated symptomatically for opioid withdrawal.   4. Smoking. Nicotine patch was available.   5. Insomnia. Trazodone was available.   6. Substance abuse treatment. The [facility]    7. Alcohol detox. He completed alcohol detox. Vital signs were stable.    8. 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  9. Acne. He is on Cleocin.  10. Disposition. He was [facility] with his father [facility] [place]   Physical Findings: AIMS: Facial and Oral Movements Muscles of Facial Expression: None, normal Lips and Perioral Area: None, normal Jaw: None, normal Tongue: None, normal,Extremity Movements Upper (arms, wrists, hands, fingers): None, normal, Trunk Movements Neck, shoulders, hips: None, normal, Overall Severity Severity of abnormal movements (highest score from questions above): None, normal Incapacitation due to abnormal movements: None, normal Patient's awareness of abnormal movements (rate only patient's report): No Awareness, Dental Status Current problems with teeth and/or dentures?: No Does patient usually wear dentures?: No  CIWA:  CIWA-Ar Total: 5 COWS:  COWS Total Score: 7  Musculoskeletal: Strength & Muscle Tone: within normal limits Gait & Station: normal Patient leans: N/A  Psychiatric Specialty Exam: Physical Exam  Nursing note and vitals reviewed.   Review of Systems  Psychiatric/Behavioral: Positive for substance abuse.  All other systems reviewed and are negative.   Blood pressure (!) 116/57, pulse 80, temperature 97.9 F (36.6 C), temperature source Oral, resp. rate 18, height 6\' 1"  (1.854 m), weight 86.6 kg (191 lb), SpO2 97 %.Body mass index is 25.2 kg/m.  General Appearance: Casual  Eye Contact:  Good  Speech:  Clear and Coherent  Volume:  Normal  Mood:  Anxious  Affect:  Appropriate  Thought  Process:  Goal Directed and Descriptions of Associations: Intact  Orientation:  Full (Time, Place, and Person)  Thought Content:  WDL  Suicidal Thoughts:  No  Homicidal Thoughts:  No  Memory:  Immediate;   Fair Recent;   Fair Remote;   Fair  Judgement:  Poor  Insight:  Shallow  Psychomotor Activity:  Normal  Concentration:  Concentration: Fair and Attention Span: Fair  Recall:  FiservFair  Fund of Knowledge:  Fair  Language:  Fair  Akathisia:  No  Handed:  Right  AIMS (if indicated):     Assets:  Communication Skills Desire for Improvement Physical Health Resilience  ADL's:  Intact  Cognition:  WNL  Sleep:  Number of Hours: 2.75     Have you used any form of tobacco in the last 30 days? (Cigarettes, Smokeless Tobacco, Cigars, and/or Pipes): Yes  Has this patient used any form of tobacco in the last 30 days? (Cigarettes, Smokeless Tobacco, Cigars, and/or Pipes) Yes, Yes, A prescription for an FDA-approved tobacco cessation medication was offered at discharge and the patient refused  Blood Alcohol level:  Lab Results  Component Value Date   ETH 28 (H) 10/17/2016   ETH <5  08/15/2016    Metabolic Disorder Labs:  No results found for: HGBA1C, MPG No results found for: PROLACTIN No results found for: CHOL, TRIG, HDL, CHOLHDL, VLDL, LDLCALC  See Psychiatric Specialty Exam and Suicide Risk Assessment completed by Attending Physician prior to discharge.  Dis[facility]  [facility]  Is patient on multiple antipsychotic therapies at discharge:  No   Has Patient had three or more failed trials of antipsychotic monotherapy by history:  No  Recommended Plan for Multiple Antipsychotic Therapies: NA     Medication List    STOP taking these medications   gabapentin 100 MG capsule Commonly known as:  NEURONTIN   hydrOXYzine 25 MG tablet Commonly known as:  ATARAX/VISTARIL   nicotine polacrilex 2 MG gum Commonly known as:  NICORETTE   traZODone  150 MG tablet Commonly known as:  DESYREL     TAKE these medications     Indication  clindamycin 300 MG capsule Commonly known as:  CLEOCIN Take 1 capsule (300 mg total) by mouth every 8 (eight) hours.  Indication:  Acne   valACYclovir 1000 MG tablet Commonly known as:  VALTREX Take 1 tablet (1,000 mg total) by mouth 2 (two) times daily as needed (outbreak). Herpes outbreak  Indication:  Genital Herpes, Herpes outbreak      Follow-up Information    [facility] Follow up.   Why:  Please arrive at the office at discharge or email them at: [email or web address] to seek admittance for residential substance abuse treatment and therapy Contact information: [facility] [address] [place], [place] [address] Ph: [phone] Do not fax;Fax not needed          Follow-up recommendations:  Activity:  as tolerated. Diet:  regular. Other:  keep follow up appointments.  Comments:    Signed: Kristine Linea, MD 10/21/2016, 9:25 AM

## 2016-10-21 NOTE — [facility] Suicide Risk Assessment (Signed)
[facility] INPATIENT:  Family/Significant Other Suicide Prevention Education  Suicide Prevention Education:  Patient Refusal for Family/Significant Other Suicide Prevention Education: The patient Scott Brock has refused to provide written consent for family/significant other to be provided Family/Significant Other Suicide Prevention Education during admission and/or prior to discharge.  Physician notified. CSW completed SPE with the pt.    Dorothe PeaJonathan F Dayron Odland 10/21/2016, 2:30 PM

## 2016-10-21 NOTE — Plan of Care (Signed)
Problem: Coping: Goal: Ability to verbalize frustrations and anger appropriately will improve Outcome: Progressing Patient verbalized frustration to staff.    

## 2016-10-21 NOTE — [facility] Suicide Risk Assessment (Signed)
[facility]   Principal Problem: Severe episode of recurrent major depressive disorder, without psychotic features [facility](HCC) Discharge Diagnoses:  Patient Active Problem List   Diagnosis Date Noted  . Suicidal ideation [R45.851] 10/17/2016  . Severe episode of recurrent major depressive disorder, without psychotic features (HCC) [F33.2] 08/16/2016  . Substance induced mood disorder (HCC) [F19.94] 07/22/2016  . Alcohol use disorder, moderate, dependence (HCC) [F10.20] 07/22/2016  . Involuntary commitment [Z04.6] 07/22/2016  . Opioid use disorder, severe, dependence (HCC) [F11.20] 03/20/2016  . Tobacco use disorder [F17.200] 03/20/2016    Total Time spent with patient: 30 minutes  Musculoskeletal: Strength & Muscle Tone: within normal limits Gait & Station: normal Patient leans: N/A  Psychiatric Specialty Exam: Review of Systems  Psychiatric/Behavioral: Positive for substance abuse.  All other systems reviewed and are negative.   Blood pressure 119/74, pulse 65, temperature 98 F (36.7 C), temperature source Oral, resp. rate 18, height 6\' 1"  (1.854 m), weight 86.6 kg (191 lb), SpO2 97 %.Body mass index is 25.2 kg/m.  General Appearance: Casual  Eye Contact::  Good  Speech:  Clear and Coherent409  Volume:  Normal  Mood:  Anxious  Affect:  Appropriate  Thought Process:  Goal Directed  Orientation:  Full (Time, Place, and Person)  Thought Content:  WDL  Suicidal Thoughts:  No  Homicidal Thoughts:  No  Memory:  Immediate;   Fair Recent;   Fair Remote;   Fair  Judgement:  Poor  Insight:  Lacking  Psychomotor Activity:  Normal  Concentration:  Fair  Recall:  Fis[employer]Fair  Fund of Knowledge:Fair  Language: Fair  Akathisia:  No  Handed:  Right  AIMS (if indicated):     Assets:  Communication Skills Desire for Improvement Physical Health Resilience Social Support  Sleep:  Number of Hours: 6.75  Cognition: WNL  ADL's:  Intact   Mental Status Per  Nursing Assessment::   On Admission:     Demographic Factors:  Male, Adolescent or young adult, Caucasian, Low socioeconomic status and Unemployed  Loss Factors: Financial problems/change in socioeconomic status  Historical Factors: Family history of mental illness or substance abuse and Impulsivity  Risk Reduction Factors:   Sense of responsibility to family, Positive social support and Positive therapeutic relationship  Continued Clinical Symptoms:  Depression:   Comorbid alcohol abuse/dependence Impulsivity Alcohol/Substance Abuse/Dependencies  Cognitive Features That Contribute To Risk:  None    Suicide Risk:  Minimal: No identifiable suicidal ideation.  Patients presenting with no risk factors but with morbid ruminations; may be classified as minimal risk based on the severity of the depressive symptoms  Follow-up Information    [facility] Follow up.   Why:  Please arrive at the office at discharge or email them at: [email or web address] to seek admittance for residential substance abuse treatment and therapy Contact information: [facility] [address] [place], [place] [address] Ph: [phone] Do not fax;Fax not needed          Plan Of Care/Follow-up recommendations:  Activity:  as tolerated. Diet:  regular. Other:  keep follow up appointments.  [name], MD 10/21/2016, 5:36 AM

## 2016-10-21 NOTE — Progress Notes (Signed)
D: Pt denies SI/HI/AVH. Pt is pleasant and cooperative with treatment plan, affect is flat but brightens upon approach. Pt stated he feels better from resting and taking his medications; he appears less anxious and he is interacting with peers and staff appropriately.  A: Pt was offered support and encouragement. Pt was given scheduled medications. Pt was encouraged to attend groups. Q 15 minute checks were done for safety.  R:Pt attends groups and interacts well with peers and staff. Pt is taking medication. Pt has no complaints.Pt receptive to treatment and safety maintained on unit.

## 2016-10-21 NOTE — [facility] Group Notes (Signed)
[facility] Group Notes:  (Nursing/MHT/Case Management/Adjunct)  Date:  10/21/2016  Time:  3:56 AM  Type of Therapy:  Psychoeducational Skills  Participation Level:  Active  Participation Quality:  Appropriate and Sharing  Affect:  Appropriate  Cognitive:  Appropriate  Insight:  Appropriate and Good  Engagement in Group:  Engaged  Modes of Intervention:  Discussion, Socialization and Support  Summary of Progress/Problems:  Scott MilroyLaquanda Y Ivery Brock 10/21/2016, 3:56 AM

## 2016-10-21 NOTE — Progress Notes (Signed)
  [facility] :  Will you be returning to the same living situation after discharge:  No, [facility] before being homeless At discharge, do you have transportation home?: Yes,  pt will be picked up by [facility] Do you have the ability to pay for your medications: Yes,  pt will be provided with prescriptions at discharge  Release of information consent forms completed and in the chart;  Patient's signature needed at discharge.  Patient to Follow up at: Follow-up Information    [facility] Follow up.   Why:  Please arrive at the office at discharge or email them at: [email or web address] to seek admittance for residential substance abuse treatment and therapy Contact information: [facility] [address] [place], [place] [address] Ph: [phone] Do not fax; Pt refused to sign       [facility] Follow up.   Why:  Please arrive to the walk-in clinic between the hours of 8am-2:30pm for an assessment for medication management, substance abuse treatment and therapy.  Please call Unk PintoHarvey Bryant at [phone] for questions and assistance. Contact information: [facility] [address] [place] [address] Ph: [phone] Fax: DO NOT FAX PT REFUSED TO SIGN             Next level of care provider has [facility] Link:no  Safety Planning and Suicide Prevention discussed: Yes,  completed with pt  Have you used any form of tobacco in the last 30 days? (Cigarettes, Smokeless Tobacco, Cigars, and/or Pipes): Yes  Has patient been referred to the Quitline?: Patient refused referral  Patient has been referred for addiction treatment: Yes  Dorothe PeaJonathan F Adoria Kawamoto 10/21/2016, 2:30 PM

## 2016-10-21 NOTE — Progress Notes (Signed)
Patient with depressed affect, withdrawn behavior. Cooperative with meals, meds and plan of care. No SI/HI at this time. Minimal interaction with peers, verbalizes needs appropriately with staff. Focused on discharge plan, safety maintained.

## 2017-06-02 ENCOUNTER — Emergency Department (HOSPITAL_COMMUNITY)
Admission: EM | Admit: 2017-06-02 | Discharge: 2017-06-02 | Disposition: A | Discharge status: Home / Self Care | Attending: Emergency Medicine | Admitting: Emergency Medicine

## 2017-06-02 DIAGNOSIS — M545 Low back pain: Secondary | ICD-10-CM | POA: Insufficient documentation

## 2017-06-02 DIAGNOSIS — F17210 Nicotine dependence, cigarettes, uncomplicated: Secondary | ICD-10-CM | POA: Insufficient documentation

## 2017-06-02 DIAGNOSIS — M5410 Radiculopathy, site unspecified: Secondary | ICD-10-CM

## 2017-06-02 DIAGNOSIS — J45909 Unspecified asthma, uncomplicated: Secondary | ICD-10-CM | POA: Diagnosis not present

## 2017-06-02 MED ORDER — ACETAMINOPHEN 500 MG PO TABS: 1000.0000 mg | ORAL_TABLET | Freq: Four times a day (QID) | ORAL | 0 refills | Status: DC | PRN

## 2017-06-02 MED ORDER — KETOROLAC TROMETHAMINE 30 MG/ML IJ SOLN
30.0000 mg | Freq: Once | INTRAMUSCULAR | Status: AC
  Administered 2017-06-02: 30 mg via INTRAMUSCULAR
  Filled 2017-06-02: qty 1

## 2017-06-02 MED ORDER — CYCLOBENZAPRINE HCL 10 MG PO TABS: 10.0000 mg | ORAL_TABLET | Freq: Two times a day (BID) | ORAL | 0 refills | Status: DC | PRN

## 2017-06-02 NOTE — ED Notes (Signed)
Bed: [id number] Expected date:  Expected time:  Means of arrival:  Comments: 36 yo M back pain, known herniated disc

## 2017-06-02 NOTE — ED Triage Notes (Signed)
Pt c/o Lower back pain states he had a injury in 2009 to L4-L5. Pt states he hurt his back at work last week and was seen in the ER and told he has L4-L5 herniated disc. Pt taking oxycodone (ran out), tylenol (last does 3 days ago) and Ibuprofen for pain. Pt alert and oriented x4. No acute distress.

## 2017-06-02 NOTE — Discharge Instructions (Signed)
Please call and follow up with our local neurosurgeon from [facility] as needed for your persistent back discomfort.

## 2017-06-02 NOTE — ED Provider Notes (Signed)
[facility]-EMERGENCY DEPT Provider Note   CSN: [id number] Arrival date & time: 06/02/17  0709     History   Chief Complaint No chief complaint on file.   HPI Scott Brock is a 36 y.o. male.  HPI   36 year old male with history of chronic back pain, renal disorder, depression, and opioid dependency as well as alcohol use disorder presenting complaining of lower back pain. Patient states overall week ago he [place] in [place] when he hurt his back. Patient states he was shoveling gravel, and grass plug when he developed sharp severe L sided back pain which radiates down his L leg.  Pain is 10/10 persistent, worse with movement.  He reported was seen at a local hospital and admitted for pain control for several days.  Sts he was discharged with steroid and oxycodone.  He ran out of the medication but the pain persists.  He currently just resting at home.  Patient denies fever, chills, abdominal pain, dysuria, hematuria, bowel bladder incontinence, saddle anesthesia, or rash. Also report prior back injury in 2009 from the [employer]. He does not have a neurosurgeon or a specialist to care for his current condition. Denies hx of IVDU or active CA.  Past Medical History:  Diagnosis Date  . Acid reflux   . Asthma   . Chronic back pain   . Renal disorder    Kidney stone  . Status post dilation of esophageal narrowing     Patient Active Problem List   Diagnosis Date Noted  . Suicidal ideation 10/17/2016  . Severe episode of recurrent major depressive disorder, without psychotic features (HCC) 08/16/2016  . Substance induced mood disorder (HCC) 07/22/2016  . Alcohol use disorder, moderate, dependence (HCC) 07/22/2016  . Involuntary commitment 07/22/2016  . Opioid use disorder, severe, dependence (HCC) 03/20/2016  . Tobacco use disorder 03/20/2016    Past Surgical History:  Procedure Laterality Date  . APPENDECTOMY    . RHINOPLASTY    . UPPER ENDOSCOPY W/ ESOPHAGEAL MANOMETRY          Home Medications    Prior to Admission medications   Medication Sig Start Date End Date Taking? Authorizing Provider  clindamycin (CLEOCIN) 300 MG capsule Take 1 capsule (300 mg total) by mouth every 8 (eight) hours. 10/21/16   Pucilowska, Braulio ConteJolanta B, MD  valACYclovir (VALTREX) 1000 MG tablet Take 1 tablet (1,000 mg total) by mouth 2 (two) times daily as needed (outbreak). Herpes outbreak 10/21/16   Shari ProwsPucilowska, Jolanta B, MD    Family History No family history on file.  Social History Social History  Substance Use Topics  . Smoking status: Current Every Day Smoker    Packs/day: 0.50    Years: 15.00    Types: Cigarettes  . Smokeless tobacco: Never Used  . Alcohol use Yes     Comment: social     Allergies   Patient has no known allergies.   Review of Systems Review of Systems  Constitutional: Negative for fever.  Gastrointestinal: Negative for abdominal pain.  Genitourinary: Negative for dysuria and hematuria.  Musculoskeletal: Positive for back pain.  Skin: Negative for rash.  Neurological: Negative for numbness.     Physical Exam Updated Vital Signs There were no vitals taken for this visit.  Physical Exam  Constitutional: He appears well-developed and well-nourished. No distress.  HENT:  Head: Atraumatic.  Eyes: Conjunctivae are normal.  Neck: Neck supple.  Cardiovascular: Intact distal pulses.   Abdominal: Soft. There is no tenderness.  Musculoskeletal: He exhibits tenderness (Tenderness to left paralumbar spinal muscle on palpation. Positive straight leg raise. No overlying skin changes to low back.).  Neurological: He is alert.  Able to ambulate. Patellar deep tendon reflex intact bilaterally. 5/5 strength to right lower extremity. 3 out 5 strength to left lower extremity with poor effort.  Skin: No rash noted.  Psychiatric: He has a normal mood and affect.  Nursing note and vitals reviewed.    ED Treatments / Results  Labs (all labs ordered  are listed, but only abnormal results are displayed) Labs Reviewed - No data to display  EKG  EKG Interpretation None       Radiology No results found.  Procedures Procedures (including critical care time)  Medications Ordered in ED Medications - No data to display   Initial Impression / Assessment and Plan / ED Course  I have reviewed the triage vital signs and the nursing notes.  Pertinent labs & imaging results that were available during my care of the patient were reviewed by me and considered in my medical decision making (see chart for details).     BP 120/72 (BP Location: Left Arm)   Pulse 75   Temp 98.3 F (36.8 C) (Oral)   Resp 16   SpO2 100%    Final Clinical Impressions(s) / ED Diagnoses   Final diagnoses:  Radicular low back pain    New Prescriptions New Prescriptions   CYCLOBENZAPRINE (FLEXERIL) 10 MG TABLET    Take 1 tablet (10 mg total) by mouth 2 (two) times daily as needed for muscle spasms.   7:54 AM Patient here with acute on chronic radicular back pain. States that it was due to herniated disc. NO Red Flags. He is able to ambulate. He is neurovascularly intact. He is not a candidate for narcotic pain medication due to history of drug abuse and alcohol abuse. He recently receive steroid medication from an outside facility as well as out of pain medication. At this time, patient will be discharge with muscle relaxants and anti-inflammatory medication. Outpatient follow-up with neurosurgeon as needed. Return precaution discussed. He is able to ambulate.    Fayrene Helper, PA-C 06/02/17 0759    Samuel Jester, DO 06/05/17 1646

## 2024-06-01 ENCOUNTER — Ambulatory Visit
Admission: EM | Admit: 2024-06-01 | Discharge: 2024-06-01 | Disposition: A | Discharge status: Home / Self Care | Attending: Nurse Practitioner | Admitting: Nurse Practitioner

## 2024-06-01 DIAGNOSIS — S29011A Strain of muscle and tendon of front wall of thorax, initial encounter: Secondary | ICD-10-CM | POA: Diagnosis not present

## 2024-06-01 DIAGNOSIS — B359 Dermatophytosis, unspecified: Secondary | ICD-10-CM | POA: Diagnosis not present

## 2024-06-01 DIAGNOSIS — S161XXA Strain of muscle, fascia and tendon at neck level, initial encounter: Secondary | ICD-10-CM | POA: Diagnosis not present

## 2024-06-01 MED ORDER — NAPROXEN 500 MG PO TABS: 500.0000 mg | ORAL_TABLET | Freq: Two times a day (BID) | ORAL | 0 refills | Status: AC

## 2024-06-01 MED ORDER — TERBINAFINE HCL 250 MG PO TABS: 250.0000 mg | ORAL_TABLET | Freq: Every day | ORAL | 0 refills | Status: AC

## 2024-06-01 MED ORDER — CYCLOBENZAPRINE HCL 10 MG PO TABS: 10.0000 mg | ORAL_TABLET | Freq: Three times a day (TID) | ORAL | 0 refills | Status: AC | PRN

## 2024-06-01 NOTE — ED Triage Notes (Signed)
 Pt c/o posterior neck pain and a sharp pain to mid chest while lifting furniture at work ~215p-had some neck pain yesterday and has cont'd and is worse with movement-denies CP-pt also c/o possible ring worm on both feet-NAD-steady gait

## 2024-06-01 NOTE — ED Notes (Signed)
 Pt returned for RTW note-NP states to have pt RTW 6/24

## 2024-06-01 NOTE — ED Provider Notes (Signed)
 [facility]    CSN: [id number] Arrival date & time: 06/01/24  1728      History   Chief Complaint Chief Complaint  Patient presents with   Neck Pain   Rash    HPI Scott Brock is a 43 y.o. male.   Scott Brock is a 43 y.o. male resents with neck pain and a skin condition. The neck pain started this morning and worsened after lifting a dining room table. The patient reports feeling a sharp pain in the back of the neck, which is exacerbated by movement, particularly when turning to the right or bending backward. The onset may be related to moving furniture yesterday, where the patient was hunched over. The pain has impacted the patient's work, as they called their boss around 2 PM to report the issue. The patient has attempted to alleviate the pain by standing in the shower and taking extra-strength Tylenol .  Additionally, the patient reports a recurring skin condition, believed to be ringworm. The rash initially appeared about 3 weeks ago after wearing old shoes without socks. The patient has been self-treating with an over-the-counter cream from [facility] for 3 weeks, which seemed to improve the condition initially, but it has since recurred. A new area of involvement appeared 3 days ago, possibly due to cross-contamination. The rash is not itchy. The patient has been diligent about applying cream at home but notes difficulty maintaining treatment during the day due to lack of bandages or patches.  The patient denies smoking or vaping. Daily medications include creatine and perfect amino supplements. The patient also uses albuterol  as needed for occasional wheezing.        Past Medical History:  Diagnosis Date   Acid reflux    Asthma    Chronic back pain    Renal disorder    Kidney stone   Status post dilation of esophageal narrowing     Patient Active Problem List   Diagnosis Date Noted   Suicidal ideation 10/17/2016   Severe episode of recurrent major depressive  disorder, without psychotic features (HCC) 08/16/2016   Substance induced mood disorder (HCC) 07/22/2016   Alcohol use disorder, moderate, dependence (HCC) 07/22/2016   Involuntary commitment 07/22/2016   Opioid use disorder, severe, dependence (HCC) 03/20/2016   Tobacco use disorder 03/20/2016    Past Surgical History:  Procedure Laterality Date   APPENDECTOMY     RHINOPLASTY     UPPER ENDOSCOPY W/ ESOPHAGEAL MANOMETRY         Home Medications    Prior to Admission medications   Medication Sig Start Date End Date Taking? Authorizing Provider  cyclobenzaprine  (FLEXERIL ) 10 MG tablet Take 1 tablet (10 mg total) by mouth 3 (three) times daily as needed for muscle spasms. 06/01/24  Yes Ajah Vanhoose, FNP  naproxen  (NAPROSYN ) 500 MG tablet Take 1 tablet (500 mg total) by mouth 2 (two) times daily with a meal. 06/01/24  Yes Maryruth Sol, FNP  terbinafine (LAMISIL) 250 MG tablet Take 1 tablet (250 mg total) by mouth daily for 14 days. 06/01/24 06/15/24 Yes Maryruth Sol, FNP    Family History No family history on file.  Social History Social History   Tobacco Use   Smoking status: Former    Current packs/day: 0.50    Average packs/day: 0.5 packs/day for 15.0 years (7.5 ttl pk-yrs)    Types: Cigarettes   Smokeless tobacco: Never  Vaping Use   Vaping status: Never Used  Substance Use Topics   Alcohol  use: Not Currently   Drug use: Not Currently    Comment: heroin     Allergies   Patient has no known allergies.   Review of Systems Review of Systems   Physical Exam Triage Vital Signs ED Triage Vitals  Encounter Vitals Group     BP 06/01/24 1759 (!) 154/98     Girls Systolic BP Percentile --      Girls Diastolic BP Percentile --      Boys Systolic BP Percentile --      Boys Diastolic BP Percentile --      Pulse Rate 06/01/24 1759 72     Resp 06/01/24 1759 16     Temp 06/01/24 1759 98.9 F (37.2 C)     Temp Source 06/01/24 1759 Oral     SpO2 06/01/24  1759 95 %     Weight --      Height --      Head Circumference --      Peak Flow --      Pain Score 06/01/24 1757 5     Pain Loc --      Pain Education --      Exclude from Growth Chart --    No data found.  Updated Vital Signs BP (!) 154/98 (BP Location: Left Arm)   Pulse 72   Temp 98.9 F (37.2 C) (Oral)   Resp 16   SpO2 95%   Visual Acuity Right Eye Distance:   Left Eye Distance:   Bilateral Distance:    Right Eye Near:   Left Eye Near:    Bilateral Near:     Physical Exam Vitals reviewed.  Constitutional:      General: He is not in acute distress.    Appearance: Normal appearance. He is not ill-appearing, toxic-appearing or diaphoretic.  HENT:     Head: Normocephalic.     Mouth/Throat:     Mouth: Mucous membranes are moist.   Cardiovascular:     Rate and Rhythm: Normal rate and regular rhythm.  Pulmonary:     Effort: Pulmonary effort is normal.     Breath sounds: Normal breath sounds.  Chest:     Chest wall: Tenderness present. No deformity or swelling.   Abdominal:     Palpations: Abdomen is soft.     Tenderness: There is no right CVA tenderness or left CVA tenderness.   Musculoskeletal:        General: Normal range of motion.     Cervical back: Normal, normal range of motion and neck supple.     Thoracic back: Normal.     Lumbar back: Normal.       Back:     Right foot: Normal range of motion.     Left foot: Normal range of motion.  Feet:     Right foot:     Toenail Condition: Right toenails are normal.     Left foot:     Toenail Condition: Left toenails are normal.     Comments: Circular, erythematous scaling patch noted to bilateral feet (see pictures below)   Skin:    General: Skin is warm and dry.   Neurological:     General: No focal deficit present.     Mental Status: He is alert and oriented to person, place, and time.     Cranial Nerves: Cranial nerves 2-12 are intact.     Sensory: Sensation is intact.     Motor: Motor function  is intact. No weakness.  Coordination: Coordination is intact.     Gait: Gait is intact.      #1: left foot   #2: right foot   UC Treatments / Results  Labs (all labs ordered are listed, but only abnormal results are displayed) Labs Reviewed - No data to display  EKG   Radiology No results found.  Procedures Procedures (including critical care time)  Medications Ordered in UC Medications - No data to display  Initial Impression / Assessment and Plan / UC Course  I have reviewed the triage vital signs and the nursing notes.  Pertinent labs & imaging results that were available during my care of the patient were reviewed by me and considered in my medical decision making (see chart for details).     Patient presents with acute neck and anterior chest wall pain that began after lifting heavy furniture at work over the past two days. Pain is aggravated by neck extension and deep breathing. No smoking history and no signs of systemic involvement. Symptoms are consistent with acute musculoskeletal strain. Treatment includes naproxen  twice daily with instructions to avoid other NSAIDs or aspirin, and Flexeril  as needed up to three times daily for muscle relaxation. Heat therapy and gentle range of motion exercises were recommended along with avoidance of heavy lifting and emphasis on proper posture. Patient was advised to follow up with PCP or orthopedics if symptoms worsen or do not improve within 1-2 weeks.  Additionally, patient reports circular lesions on the feet, consistent with tinea infection. Symptoms began approximately three weeks ago after wearing old shoes without socks. The condition initially improved with over-the-counter antifungal cream but recurred three days ago with spread to another area of the foot. No itching reported. Given failure of topical therapy, terbinafine 250 mg daily for 2 weeks was prescribed. Patient was advised to wear clean, dry socks, avoid  walking barefoot in public areas, and disinfect or discard old footwear. Follow-up with PCP was recommended if there is no improvement after completing the antifungal treatment.  Today's evaluation has revealed no signs of a dangerous process. Discussed diagnosis with patient and/or guardian. Patient and/or guardian aware of their diagnosis, possible red flag symptoms to watch out for and need for close follow up. Patient and/or guardian understands verbal and written discharge instructions. Patient and/or guardian comfortable with plan and disposition.  Patient and/or guardian has a clear mental status at this time, good insight into illness (after discussion and teaching) and has clear judgment to make decisions regarding their care  Documentation was completed with the aid of voice recognition software. Transcription may contain typographical errors. Final Clinical Impressions(s) / UC Diagnoses   Final diagnoses:  Strain of muscle, fascia and tendon at neck level, initial encounter  Muscle strain of chest wall, initial encounter  Tinea     Discharge Instructions      You were seen today for neck and chest wall pain that started after lifting heavy furniture. Your symptoms are consistent with a muscular strain. You were prescribed naproxen  to take twice a day for pain and inflammation and Flexeril  to take up to three times daily as needed for muscle spasms. Do not take other NSAIDs or aspirin while using naproxen . Apply heat to the affected areas and perform gentle neck and shoulder movements as tolerated. Avoid heavy lifting or strenuous activity and try to maintain good posture, especially while sitting or sleeping.  You also have a fungal infection on your foot consistent with tinea. Since your symptoms came  back after using over-the-counter cream, you were prescribed terbinafine 250 mg to take once daily for 2 weeks. Keep your feet clean and dry, change socks daily, and avoid walking  barefoot in public places. Disinfect or throw away old shoes that may be contributing to reinfection.  Follow up with your primary care provider if your neck or chest pain does not improve in 1-2 weeks, or sooner if pain worsens or you develop numbness, weakness, or difficulty breathing. Contact your provider if the foot rash does not improve after finishing your antifungal treatment or if it spreads. Seek emergency care if you develop severe chest pain, trouble breathing, or signs of infection such as fever, redness, or swelling.      ED Prescriptions     Medication Sig Dispense Auth. Provider   naproxen  (NAPROSYN ) 500 MG tablet Take 1 tablet (500 mg total) by mouth 2 (two) times daily with a meal. 20 tablet Maryruth Sol, FNP   cyclobenzaprine  (FLEXERIL ) 10 MG tablet Take 1 tablet (10 mg total) by mouth 3 (three) times daily as needed for muscle spasms. 20 tablet Maryruth Sol, FNP   terbinafine (LAMISIL) 250 MG tablet Take 1 tablet (250 mg total) by mouth daily for 14 days. 14 tablet Maryruth Sol, FNP      PDMP not reviewed this encounter.   Maryruth Sol, [place] 06/01/24 [phone]

## 2024-06-01 NOTE — Discharge Instructions (Addendum)
 You were seen today for neck and chest wall pain that started after lifting heavy furniture. Your symptoms are consistent with a muscular strain. You were prescribed naproxen  to take twice a day for pain and inflammation and Flexeril  to take up to three times daily as needed for muscle spasms. Do not take other NSAIDs or aspirin while using naproxen . Apply heat to the affected areas and perform gentle neck and shoulder movements as tolerated. Avoid heavy lifting or strenuous activity and try to maintain good posture, especially while sitting or sleeping.  You also have a fungal infection on your foot consistent with tinea. Since your symptoms came back after using over-the-counter cream, you were prescribed terbinafine 250 mg to take once daily for 2 weeks. Keep your feet clean and dry, change socks daily, and avoid walking barefoot in public places. Disinfect or throw away old shoes that may be contributing to reinfection.  Follow up with your primary care provider if your neck or chest pain does not improve in 1-2 weeks, or sooner if pain worsens or you develop numbness, weakness, or difficulty breathing. Contact your provider if the foot rash does not improve after finishing your antifungal treatment or if it spreads. Seek emergency care if you develop severe chest pain, trouble breathing, or signs of infection such as fever, redness, or swelling.

## 2024-07-28 ENCOUNTER — Ambulatory Visit
Admission: EM | Admit: 2024-07-28 | Discharge: 2024-07-28 | Disposition: A | Discharge status: Home / Self Care | Attending: Family Medicine | Admitting: Family Medicine

## 2024-07-28 DIAGNOSIS — B353 Tinea pedis: Secondary | ICD-10-CM | POA: Diagnosis not present

## 2024-07-28 MED ORDER — FLUCONAZOLE 150 MG PO TABS: 150.0000 mg | ORAL_TABLET | ORAL | 0 refills | Status: AC

## 2024-07-28 MED ORDER — CLOTRIMAZOLE-BETAMETHASONE 1-0.05 % EX CREA: TOPICAL_CREAM | Freq: Two times a day (BID) | CUTANEOUS | 0 refills | Status: AC

## 2024-07-28 NOTE — ED Triage Notes (Signed)
 Pt reports he has ringworm in feet x 2-3 months. States he was taking meds fro and lost the meds.

## 2024-07-28 NOTE — ED Provider Notes (Signed)
 [facility]  Note:  This document was prepared using Conservation officer, historic buildings and may include unintentional dictation errors.  MRN: [id number] DOB: Jul 20, 1981  Subjective:   Scott Brock is a 43 y.o. male presenting for 29-month history of persistent ringworm fungal infection of his feet.  Patient was initially seen in June and was started on Lamisil .  He had good improvement but unfortunately then lost the medication after taking it for 6 days.  He was prescribed a 2-week course.  The rash is since returned and he has not done much treatment for it since then.  No current facility-administered medications for this encounter.  Current Outpatient Medications:    cyclobenzaprine  (FLEXERIL ) 10 MG tablet, Take 1 tablet (10 mg total) by mouth 3 (three) times daily as needed for muscle spasms., Disp: 20 tablet, Rfl: 0   naproxen  (NAPROSYN ) 500 MG tablet, Take 1 tablet (500 mg total) by mouth 2 (two) times daily with a meal., Disp: 20 tablet, Rfl: 0   No Known Allergies  Past Medical History:  Diagnosis Date   Acid reflux    Asthma    Chronic back pain    Renal disorder    Kidney stone   Status post dilation of esophageal narrowing      Past Surgical History:  Procedure Laterality Date   APPENDECTOMY     RHINOPLASTY     UPPER ENDOSCOPY W/ ESOPHAGEAL MANOMETRY      History reviewed. No pertinent family history.  Social History   Tobacco Use   Smoking status: Former    Current packs/day: 0.50    Average packs/day: 0.5 packs/day for 15.0 years (7.5 ttl pk-yrs)    Types: Cigarettes   Smokeless tobacco: Never  Vaping Use   Vaping status: Never Used  Substance Use Topics   Alcohol use: Not Currently   Drug use: Not Currently    Comment: heroin    ROS   Objective:   Vitals: BP (!) 149/83 (BP Location: Right Arm)   Pulse 72   Temp 98.6 F (37 C) (Oral)   Resp 18   SpO2 97%   Physical Exam Constitutional:      General: He is not in  acute distress.    Appearance: Normal appearance. He is well-developed and normal weight. He is not ill-appearing, toxic-appearing or diaphoretic.  HENT:     Head: Normocephalic and atraumatic.     Right Ear: External ear normal.     Left Ear: External ear normal.     Nose: Nose normal.     Mouth/Throat:     Pharynx: Oropharynx is clear.  Eyes:     General: No scleral icterus.       Right eye: No discharge.        Left eye: No discharge.     Extraocular Movements: Extraocular movements intact.  Cardiovascular:     Rate and Rhythm: Normal rate.  Pulmonary:     Effort: Pulmonary effort is normal.  Musculoskeletal:     Cervical back: Normal range of motion.  Skin:    Comments: Multiple dry scaly patches of varying sizes over the dorsal and lateral aspect of the feet bilaterally.  Neurological:     Mental Status: He is alert and oriented to person, place, and time.  Psychiatric:        Mood and Affect: Mood normal.        Behavior: Behavior normal.        Thought Content:  Thought content normal.        Judgment: Judgment normal.           Assessment and Plan :   PDMP not reviewed this encounter.  1. Tinea pedis of both feet    Will manage for tinea infection with oral fluconazole  once weekly and clotrimazole -betamethasone  twice daily for 2 weeks.  Counseled patient on potential for adverse effects with medications prescribed/recommended today, ER and return-to-clinic precautions discussed, patient verbalized understanding.    Christopher Savannah, [place] 07/28/24 1335
